# Patient Record
Sex: Female | Born: 1945 | Race: White | Hispanic: No | Marital: Married | Smoking: Never smoker
Health system: Southern US, Community
[De-identification: ages and names within clinical notes are randomized; demographics above are authoritative.]

## PROBLEM LIST (undated history)

## (undated) DIAGNOSIS — I2510 Atherosclerotic heart disease of native coronary artery without angina pectoris: Secondary | ICD-10-CM

## (undated) DIAGNOSIS — R51 Headache: Secondary | ICD-10-CM

## (undated) DIAGNOSIS — M1990 Unspecified osteoarthritis, unspecified site: Secondary | ICD-10-CM

## (undated) DIAGNOSIS — K219 Gastro-esophageal reflux disease without esophagitis: Secondary | ICD-10-CM

## (undated) DIAGNOSIS — Z9889 Other specified postprocedural states: Secondary | ICD-10-CM

## (undated) DIAGNOSIS — G4730 Sleep apnea, unspecified: Secondary | ICD-10-CM

## (undated) DIAGNOSIS — R112 Nausea with vomiting, unspecified: Secondary | ICD-10-CM

## (undated) HISTORY — PX: ESOPHAGOGASTRIC FUNDOPLICATION: SHX405

## (undated) HISTORY — PX: ABDOMINAL HYSTERECTOMY: SHX81

## (undated) HISTORY — DX: Atherosclerotic heart disease of native coronary artery without angina pectoris: I25.10

## (undated) HISTORY — PX: TUBAL LIGATION: SHX77

## (undated) HISTORY — PX: CHOLECYSTECTOMY: SHX55

## (undated) HISTORY — PX: TONSILLECTOMY: SUR1361

## (undated) HISTORY — PX: UPPER GI ENDOSCOPY: SHX6162

## (undated) HISTORY — PX: APPENDECTOMY: SHX54

## (undated) HISTORY — PX: HELLER MYOTOMY: SHX5259

## (undated) HISTORY — PX: DIAGNOSTIC LAPAROSCOPY: SUR761

---

## 1999-01-31 ENCOUNTER — Ambulatory Visit (HOSPITAL_COMMUNITY): Admission: RE | Admit: 1999-01-31 | Discharge: 1999-01-31 | Payer: Self-pay | Admitting: Unknown Physician Specialty

## 1999-07-09 ENCOUNTER — Encounter: Payer: Self-pay | Admitting: Gynecology

## 1999-07-09 ENCOUNTER — Encounter: Admission: RE | Admit: 1999-07-09 | Discharge: 1999-07-09 | Payer: Self-pay | Admitting: Gynecology

## 1999-07-17 ENCOUNTER — Encounter: Admission: RE | Admit: 1999-07-17 | Discharge: 1999-07-17 | Payer: Self-pay | Admitting: Gynecology

## 1999-07-17 ENCOUNTER — Encounter: Payer: Self-pay | Admitting: Gynecology

## 2000-09-30 ENCOUNTER — Encounter: Payer: Self-pay | Admitting: Emergency Medicine

## 2000-09-30 ENCOUNTER — Emergency Department (HOSPITAL_COMMUNITY): Admission: EM | Admit: 2000-09-30 | Discharge: 2000-09-30 | Payer: Self-pay | Admitting: Emergency Medicine

## 2001-02-01 ENCOUNTER — Encounter: Admission: RE | Admit: 2001-02-01 | Discharge: 2001-02-01 | Payer: Self-pay | Admitting: Gynecology

## 2001-02-01 ENCOUNTER — Encounter: Payer: Self-pay | Admitting: Gynecology

## 2002-03-07 ENCOUNTER — Other Ambulatory Visit: Admission: RE | Admit: 2002-03-07 | Discharge: 2002-03-07 | Payer: Self-pay | Admitting: Gynecology

## 2002-03-07 ENCOUNTER — Encounter: Payer: Self-pay | Admitting: Unknown Physician Specialty

## 2002-03-07 ENCOUNTER — Encounter: Admission: RE | Admit: 2002-03-07 | Discharge: 2002-03-07 | Payer: Self-pay | Admitting: Unknown Physician Specialty

## 2002-05-26 HISTORY — PX: BACK SURGERY: SHX140

## 2002-05-26 HISTORY — PX: BREAST EXCISIONAL BIOPSY: SUR124

## 2003-03-30 ENCOUNTER — Encounter: Admission: RE | Admit: 2003-03-30 | Discharge: 2003-03-30 | Payer: Self-pay | Admitting: Gynecology

## 2003-04-12 ENCOUNTER — Encounter (INDEPENDENT_AMBULATORY_CARE_PROVIDER_SITE_OTHER): Payer: Self-pay | Admitting: *Deleted

## 2003-04-12 ENCOUNTER — Ambulatory Visit (HOSPITAL_COMMUNITY): Admission: RE | Admit: 2003-04-12 | Discharge: 2003-04-12 | Payer: Self-pay | Admitting: General Surgery

## 2003-04-12 ENCOUNTER — Ambulatory Visit (HOSPITAL_BASED_OUTPATIENT_CLINIC_OR_DEPARTMENT_OTHER): Admission: RE | Admit: 2003-04-12 | Discharge: 2003-04-12 | Payer: Self-pay | Admitting: General Surgery

## 2003-04-12 HISTORY — PX: BREAST BIOPSY: SHX20

## 2004-04-15 ENCOUNTER — Encounter: Admission: RE | Admit: 2004-04-15 | Discharge: 2004-04-15 | Payer: Self-pay | Admitting: Unknown Physician Specialty

## 2004-08-09 ENCOUNTER — Encounter: Admission: RE | Admit: 2004-08-09 | Discharge: 2004-08-09 | Payer: Self-pay | Admitting: Gynecology

## 2005-03-25 ENCOUNTER — Other Ambulatory Visit: Admission: RE | Admit: 2005-03-25 | Discharge: 2005-03-25 | Payer: Self-pay | Admitting: Gynecology

## 2005-07-04 ENCOUNTER — Encounter: Admission: RE | Admit: 2005-07-04 | Discharge: 2005-07-04 | Payer: Self-pay | Admitting: Unknown Physician Specialty

## 2005-12-12 ENCOUNTER — Encounter: Admission: RE | Admit: 2005-12-12 | Discharge: 2005-12-12 | Payer: Self-pay | Admitting: Specialist

## 2006-03-05 ENCOUNTER — Inpatient Hospital Stay (HOSPITAL_COMMUNITY): Admission: RE | Admit: 2006-03-05 | Discharge: 2006-03-16 | Payer: Self-pay | Admitting: Specialist

## 2006-03-08 ENCOUNTER — Encounter: Payer: Self-pay | Admitting: Vascular Surgery

## 2006-03-16 ENCOUNTER — Ambulatory Visit: Payer: Self-pay | Admitting: Internal Medicine

## 2006-05-27 ENCOUNTER — Encounter: Admission: RE | Admit: 2006-05-27 | Discharge: 2006-05-27 | Payer: Self-pay | Admitting: Unknown Physician Specialty

## 2006-06-02 ENCOUNTER — Encounter: Admission: RE | Admit: 2006-06-02 | Discharge: 2006-06-02 | Payer: Self-pay | Admitting: Unknown Physician Specialty

## 2006-06-17 ENCOUNTER — Encounter: Admission: RE | Admit: 2006-06-17 | Discharge: 2006-06-17 | Payer: Self-pay | Admitting: Unknown Physician Specialty

## 2006-06-23 ENCOUNTER — Encounter (INDEPENDENT_AMBULATORY_CARE_PROVIDER_SITE_OTHER): Payer: Self-pay | Admitting: Specialist

## 2006-06-23 ENCOUNTER — Encounter: Admission: RE | Admit: 2006-06-23 | Discharge: 2006-06-23 | Payer: Self-pay | Admitting: Unknown Physician Specialty

## 2006-06-23 HISTORY — PX: BREAST BIOPSY: SHX20

## 2006-07-13 ENCOUNTER — Ambulatory Visit (HOSPITAL_BASED_OUTPATIENT_CLINIC_OR_DEPARTMENT_OTHER): Admission: RE | Admit: 2006-07-13 | Discharge: 2006-07-14 | Payer: Self-pay | Admitting: General Surgery

## 2006-07-13 ENCOUNTER — Encounter: Admission: RE | Admit: 2006-07-13 | Discharge: 2006-07-13 | Payer: Self-pay | Admitting: General Surgery

## 2006-07-13 ENCOUNTER — Encounter (INDEPENDENT_AMBULATORY_CARE_PROVIDER_SITE_OTHER): Payer: Self-pay | Admitting: *Deleted

## 2006-07-13 HISTORY — PX: BREAST EXCISIONAL BIOPSY: SUR124

## 2007-07-14 ENCOUNTER — Encounter: Admission: RE | Admit: 2007-07-14 | Discharge: 2007-07-14 | Payer: Self-pay | Admitting: Unknown Physician Specialty

## 2008-03-15 ENCOUNTER — Encounter: Admission: RE | Admit: 2008-03-15 | Discharge: 2008-03-15 | Payer: Self-pay | Admitting: Family Medicine

## 2008-07-15 ENCOUNTER — Encounter: Admission: RE | Admit: 2008-07-15 | Discharge: 2008-07-15 | Payer: Self-pay | Admitting: Specialist

## 2008-12-02 ENCOUNTER — Encounter: Payer: Self-pay | Admitting: Cardiovascular Disease

## 2008-12-07 ENCOUNTER — Ambulatory Visit: Payer: Self-pay

## 2008-12-07 ENCOUNTER — Encounter: Payer: Self-pay | Admitting: Cardiovascular Disease

## 2009-01-05 DIAGNOSIS — G43909 Migraine, unspecified, not intractable, without status migrainosus: Secondary | ICD-10-CM | POA: Insufficient documentation

## 2009-01-05 DIAGNOSIS — H9319 Tinnitus, unspecified ear: Secondary | ICD-10-CM | POA: Insufficient documentation

## 2009-01-05 DIAGNOSIS — Z860100 Personal history of colon polyps, unspecified: Secondary | ICD-10-CM | POA: Insufficient documentation

## 2009-01-05 DIAGNOSIS — F329 Major depressive disorder, single episode, unspecified: Secondary | ICD-10-CM

## 2009-01-05 DIAGNOSIS — M48061 Spinal stenosis, lumbar region without neurogenic claudication: Secondary | ICD-10-CM | POA: Insufficient documentation

## 2009-01-05 DIAGNOSIS — F411 Generalized anxiety disorder: Secondary | ICD-10-CM | POA: Insufficient documentation

## 2009-01-05 DIAGNOSIS — Z8719 Personal history of other diseases of the digestive system: Secondary | ICD-10-CM

## 2009-01-05 DIAGNOSIS — E785 Hyperlipidemia, unspecified: Secondary | ICD-10-CM

## 2009-01-09 ENCOUNTER — Ambulatory Visit: Payer: Self-pay | Admitting: Cardiovascular Disease

## 2009-01-09 DIAGNOSIS — R42 Dizziness and giddiness: Secondary | ICD-10-CM

## 2009-01-09 DIAGNOSIS — I10 Essential (primary) hypertension: Secondary | ICD-10-CM | POA: Insufficient documentation

## 2009-01-15 ENCOUNTER — Encounter (INDEPENDENT_AMBULATORY_CARE_PROVIDER_SITE_OTHER): Payer: Self-pay

## 2009-01-15 ENCOUNTER — Ambulatory Visit: Payer: Self-pay

## 2009-01-15 ENCOUNTER — Encounter: Payer: Self-pay | Admitting: Cardiology

## 2009-01-19 ENCOUNTER — Telehealth: Payer: Self-pay | Admitting: Cardiovascular Disease

## 2009-01-22 ENCOUNTER — Encounter: Payer: Self-pay | Admitting: Cardiovascular Disease

## 2009-05-03 ENCOUNTER — Ambulatory Visit: Payer: Self-pay | Admitting: Cardiovascular Disease

## 2009-10-29 ENCOUNTER — Encounter: Admission: RE | Admit: 2009-10-29 | Discharge: 2009-10-29 | Payer: Self-pay | Admitting: Family Medicine

## 2009-10-31 ENCOUNTER — Encounter: Admission: RE | Admit: 2009-10-31 | Discharge: 2009-10-31 | Payer: Self-pay | Admitting: Family Medicine

## 2009-11-26 ENCOUNTER — Ambulatory Visit (HOSPITAL_BASED_OUTPATIENT_CLINIC_OR_DEPARTMENT_OTHER): Admission: RE | Admit: 2009-11-26 | Discharge: 2009-11-26 | Payer: Self-pay | Admitting: Otolaryngology

## 2009-12-01 ENCOUNTER — Ambulatory Visit: Payer: Self-pay | Admitting: Internal Medicine

## 2010-05-23 ENCOUNTER — Encounter
Admission: RE | Admit: 2010-05-23 | Discharge: 2010-05-23 | Payer: Self-pay | Source: Home / Self Care | Attending: Family Medicine | Admitting: Family Medicine

## 2010-06-11 ENCOUNTER — Telehealth (INDEPENDENT_AMBULATORY_CARE_PROVIDER_SITE_OTHER): Payer: Self-pay | Admitting: *Deleted

## 2010-06-12 ENCOUNTER — Encounter (HOSPITAL_COMMUNITY)
Admission: RE | Admit: 2010-06-12 | Discharge: 2010-06-25 | Payer: Self-pay | Source: Home / Self Care | Attending: Family Medicine | Admitting: Family Medicine

## 2010-06-12 ENCOUNTER — Encounter: Payer: Self-pay | Admitting: Internal Medicine

## 2010-06-12 ENCOUNTER — Ambulatory Visit: Admission: RE | Admit: 2010-06-12 | Discharge: 2010-06-12 | Payer: Self-pay | Source: Home / Self Care

## 2010-06-12 ENCOUNTER — Encounter: Payer: Self-pay | Admitting: Cardiology

## 2010-06-16 ENCOUNTER — Encounter: Payer: Self-pay | Admitting: Specialist

## 2010-06-27 NOTE — Progress Notes (Signed)
Summary: nuc pre procedure  Phone Note Outgoing Call Call back at Home Phone [phone]   Call placed by: Cathlyn Parsons RN,  June 11, 2010 2:34 PM Call placed to: Patient Summary of Call: Reviewed information on Myoview Information Sheet (see scanned document for further details).  Spoke with patient.      Nuclear Med Background Indications for Stress Test: Evaluation for Ischemia   History: Echo  History Comments: 10 Echo with EF=nl  Symptoms: Chest Pain    Nuclear Pre-Procedure Cardiac Risk Factors: CVA, Family History - CAD, Hypertension, Lipids, TIA Height (in): 61

## 2010-06-27 NOTE — Assessment & Plan Note (Addendum)
Summary: Cardiology Nuclear Testing  Nuclear Med Background Indications for Stress Test: Evaluation for Ischemia   History: Echo, Myocardial Perfusion Study  History Comments: '93 JXB:JYNWGN; '10 Echo:EF=65%  Symptoms: Chest Pressure, DOE, Light-Headedness, Palpitations  Symptoms Comments: Last episode of CP:2-3 months ago.   Nuclear Pre-Procedure Cardiac Risk Factors: CVA, Family History - CAD, Hypertension, Lipids, Obesity, TIA Caffeine/Decaff Intake: None NPO After: 9:00 PM Lungs: Clear IV 0.9% NS with Angio Cath: 22g     IV Site: R Hand IV Started by: Stanton Kidney, EMT-P Chest Size (in) 38     Cup Size B     Height (in): 61 Weight (lb): 181 BMI: 34.32  Nuclear Med Study 1 or 2 day study:  1 day     Stress Test Type:  Stress Reading MD:  Marca Ancona, MD     Referring MD:  Blane Ohara, MD Resting Radionuclide:  Technetium 50m Tetrofosmin     Resting Radionuclide Dose:  11 mCi  Stress Radionuclide:  Technetium 75m Tetrofosmin     Stress Radionuclide Dose:  33 mCi   Stress Protocol Exercise Time (min):  5:00 min     Max HR:  155 bpm     Predicted Max HR:  154 bpm  Max Systolic BP: 217 mm Hg     Percent Max HR:  100.65 %     METS: 7.0 Rate Pressure Product:  56213    Stress Test Technologist:  Rea College, CMA-N     Nuclear Technologist:  Doyne Keel, CNMT  Rest Procedure  Myocardial perfusion imaging was performed at rest 45 minutes following the intravenous administration of Technetium 10m Tetrofosmin.  Stress Procedure  The patient exercised for five minutes on the Bruce protocol.  The patient stopped due to fatigue and denied any chest pain.  There were no diagnostic ST-T wave changes, only occasional PAC's.  She did have a hypertensive response immediately post exercise, 217/109.  Technetium 64m Tetrofosmin was injected at peak exercise and myocardial perfusion imaging was performed after a brief delay.  QPS Raw Data Images:  Normal; no motion artifact; normal  heart/lung ratio. Stress Images:  Normal homogeneous uptake in all areas of the myocardium. Rest Images:  Normal homogeneous uptake in all areas of the myocardium. Subtraction (SDS):  Normal Transient Ischemic Dilatation:  .99  (Normal <1.22)  Lung/Heart Ratio:  .33  (Normal <0.45)  Quantitative Gated Spect Images QGS EDV:  58 ml QGS ESV:  21 ml QGS EF:  65 %  Findings Normal nuclear study      Overall Impression  Exercise Capacity: Fair exercise capacity. BP Response: Hypertensive blood pressure response. Clinical Symptoms: There is dyspnea. ECG Impression: No significant ST segment change suggestive of ischemia. Overall Impression: Normal stress nuclear study.  Appended Document: Cardiology Nuclear Testing copy faxed to Dr. Sedalia Muta

## 2010-07-15 HISTORY — PX: ESOPHAGOGASTRODUODENOSCOPY: SHX1529

## 2010-07-15 HISTORY — PX: COLONOSCOPY: SHX174

## 2010-10-11 NOTE — Consult Note (Signed)
NAME:  Jessica Gould, Jessica Gould NO.:  [id number]   MEDICAL RECORD NO.:  [id number]          PATIENT TYPE:  INP   LOCATION:  5015                         FACILITY:  [facility]   PHYSICIAN:  Iva Boop, MD,FACGDATE OF BIRTH:  18-Nov-1945   DATE OF CONSULTATION:  DATE OF DISCHARGE:                                   CONSULTATION   REASON FOR CONSULTATION:  Chest pain and dysphagia.   ASSESSMENT:  A 65 year old white woman with a long history of recurrent  dysphagia over the years.  She had been cared for by Dr. Laurell Roof  in [place] and has had a dilation sometime in the past several months it  seems.  She had a worsening dysphagia here after being admitted subsequent  to a lumbar disk surgery on March 05, 2006.  A CT scan has shown  thickening of the distal esophagus (CT ordered to rule out pulmonary embolus  in the because of chest pain).  We think the chest pain is probably related  to her dysphagia at this point.   PLAN:  Upper GI endoscopy with possible esophageal dilation.  This is  explained to the patient.  She indicates that she would like to follow up  with Dr. Jennye Boroughs once she leaves the hospital.   HISTORY:  As above.  She has had worsening progressive dysphagia over the  past 2 to 3 months.  She has had 2 or 3 dilations over the past years.  She  says that it runs in her family.  She does not have much in the way of  heartburn, though she takes AcipHex or acid depression at home.  As far as  other GI history is concerned, she has had colonoscopies and a polypectomy  in the past.   CURRENT MEDICATIONS:  Dulcolax, Premarin, Colace, OxyContin, MiraLax,  Prozac, Robaxin, Medrol Dosepak, Protonix 40 mg daily, potassium chloride,  and Lyrica.   ALLERGIES:  CATGUT SUTURE, PENICILLIN, SULFA, NSAIDs HAVE CAUSED TINNITUS.   PAST MEDICAL HISTORY:  1. Migraine headaches.  2. Depression.  3. Anxiety.  4. Tinnitus.  5. Dyslipidemia.  6. Colon  polyps.  7. GERD.  8. Esophageal strictures.  9. Spinal stenosis.   PAST SURGICAL HISTORY:  1. Total abdominal hysterectomy and bilateral salpingo-oophorectomy.  2. Cesarean sections.  3. Benign left breast biopsy.  4. Right shoulder surgery.  5. Cholecystectomy.  6. Appendectomy.  7. L-spine decompression.   FAMILY HISTORY:  Renal cell carcinoma.  Coronary artery disease in her  father.  Her mother had stomach cancer and an abdominal aneurysm.   SOCIAL HISTORY:  She is married and lives in [place].  She is an  Production designer, theatre/television/film at a [employer].  No alcohol or tobacco.   REVIEW OF SYSTEMS:  She has had some right lower extremity numbness since  her surgery.  She has had difficulty getting an IV.  She has had a PICC line  placed and some mild dyspnea.  She has chronic recurrent migraine and stress  headaches.  She has low back pain.  She has a permanent front bridge, as far  as  dentures.  All other systems appear negative at this time.   PHYSICAL EXAMINATION:  GENERAL:  Physical exam reveals an obese, middle-aged  white woman.  VITAL SIGNS:  Temperature 98.3, pulse 80, blood pressure 183/85.  HEENT:  Eyes nonicteric.  ENT normal mouth and posterior pharynx.  NECK:  Supple with no thyromegaly or mass.  CHEST:  Clear anteriorly.  HEART:  S1, S2.  No rubs or gallops.  ABDOMEN:  Abdomen is soft, nontender, without organomegaly or mass.  Bowel  sounds are present.  EXTREMITIES:  Extremities show no lower extremity edema.  The skin is warm  and dry without acute rash.  LYMPH NODES:  No neck or supraclavicular nodes detected.  PSYCH:  She is alert and oriented x3.   LABORATORY DATA:  Hemoglobin is 9.6, white count 4.2, hematocrit 28, BMET is  normal, sodium 137, potassium 3.3 (this is slightly low), chloride 104, CO2  28, BUN 4, creatinine 0.6, glucose 118.   NOTE:  I have reviewed the CT report and the other labs in the hospital  chart.   Her hemoglobin was normal upon  admission, so I am assuming this is a  postoperative anemia sort of problem.  It will need follow up at discharge.   I appreciate the opportunity to care for this patient.      Iva Boop, MD,FACG  Electronically Signed     CEG/MEDQ  D:  03/11/2006  T:  03/12/2006  Job:  [id number]   cc:   Kerrin Champagne, M.D.  Lovette Cliche Misenheimer

## 2010-10-11 NOTE — Discharge Summary (Signed)
NAME:  Jessica Gould, Jessica Gould NO.:  [id number]   MEDICAL RECORD NO.:  [id number]          PATIENT TYPE:  INP   LOCATION:  5015                         FACILITY:  [facility]   PHYSICIAN:  Kerrin Champagne, M.D.   DATE OF BIRTH:  10-May-1946   DATE OF ADMISSION:  03/05/2006  DATE OF DISCHARGE:  03/16/2006                               DISCHARGE SUMMARY   ADMISSION DIAGNOSES:  1. 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  2. Migraine headaches.  3. Depression.  4. Anxiety.  5. Tinnitus.  6. Dyslipidemia.  7. History of colon polyps.  8. History of gastroesophageal reflux disease.  9. History of esophageal strictures requiring dilatation.   DISCHARGE DIAGNOSES:  1. Degenerative spondylolisthesis L4-5 with bilateral L5 nerve root      entrapment secondary to lateral recess stenosis and degenerative      spondylolisthesis L4 on L5.  2. Moderate central canal stenosis.  3. Migraine headaches.  4. Depression.  5. Anxiety.  6. Tinnitus.  7. Dyslipidemia.  8. History of colon polyps.  9. History of gastroesophageal reflux disease.  10.History of esophageal strictures requiring dilatation.  11.Complications of hardware lumbar spine right L4 nerve impingement      with right L4 pedicle screw.  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  13.Recurrent dysphasia requiring esophageal dilatation during the      hospital stay.  14.Hypokalemia resolved at discharge.  15.Posthemorrhagic anemia.  16.Mild hyperglycemia.   PROCEDURES:  On admission March 05, 2006 the patient underwent:  1. Central decompressive laminectomy L4-5 with bilateral L4 and      bilateral L4 nerve root decompression.  2. Left L4-5 transforaminal lumbar interbody fusion with DePuy Leopard      cage and local bone graft.  3. Posterolateral fusion L4-L5 with  combination of local bone graft      and Symphony allograft bone graft.  4. Single level fixed internal fixation using pedicle screws and rods.  5. Performed on March 11, 2006.  The patient underwent an endoscopy      and esophageal dilatation by Dr. Leone Payor.  6. On March 16, 2006, the patient underwent right hip intra-      articular injection by the radiology department.  7. On March 14, 2006 the patient underwent removal of right L4-5      pedicle screw and rod an exploration of right L4 nerve root by Dr.      Otelia Sergeant under general anesthesia.   TESTS PERFORMED DURING THE HOSPITAL STAY:  Included a Doppler study of  the lower extremities which ruled out DVT done on March 08, 2006.  Myelogram and post myelogram CT scan performed on March 09, 2006 and  spiral CT of the chest on March 11, 2006 with no evidence of pulmonary  emboli and PICC line placement on March 10, 2006.   HISTORY:  The patient is a 65 year old female with history of chronic  low back pain with radiation into her  buttocks and thighs.  She has had  worsening of her discomfort to the point of having difficulty with  prolonged standing and ambulation.  She requires narcotic medications  which do not even relieve her discomfort.  Facette injections initially  were of benefit in diminishing her discomfort but she has had  recurrence.  Studies including myelogram and post myelogram CT  demonstrates severe arthrosis changes at the L4-5 level with grade 1  spondylolisthesis, bilateral lateral recess stenosis, moderate central  canal stenosis at this level.  Flexion and extension radiographs did  show motion at the L4-5 spondylolisthesis worsening over a few  millimeters with motion.  It was felt that she would require surgical  intervention and was admitted for the procedure as stated above.   BRIEF HOSPITAL COURSE:  The patient tolerated the procedure under  general anesthesia without complications.   Postoperatively, the patient  was treated for early ileus with Reglan IV.  She was kept n.p.o. until  she had bowel sounds.  Potassium was added to her IV fluids and she also  received oral supplementation.  Eventually she was having flatus with  normal bowel sounds and her diet was progressed.  She was also given  Magic Mouth Wash for her sore throat.  The patient developed significant  right leg pain postoperatively.  She required abundant narcotic  medications IV and p.o. for the first several days postoperatively to  get relief of her symptoms.  Doppler study of the right lower extremity  was performed and no DVT was noted.  She was started on a Medrol Dosepak  as well as Lyrica, both of which gave her minimal relief of her  symptoms.  Eventually a myelogram and post myelogram CT were performed.  There was notable impingement at the right L4 level.  The patient was  taken back to surgery for removal of the pedicle screw as stated above.   The patient developed chest pain during the hospital stay which she felt  was related to gastroesophageal reflux disease and esophageal  strictures.  A GI consult was obtained and Dr. Leone Payor to the patient to  surgery and underwent endoscopy and esophageal dilatation.  Prior to  this, she had received a spiral CT to rule out pulmonary emboli which  was negative for pulmonary emboli but did show distal esophageal  thickening.  Cardiac enzymes were obtained which were also negative.  The patient did have relief of her symptoms after the esophageal  dilatation.  She was on Protonix as well.  A PICC line was placed at  during the hospital stay as she continued to need IV fluids.   The patient had complaints of inguinal pain on the right towards the  latter part of her hospital stay.  Plain radiographs demonstrated only  arthrosis of the right hip joint with superolateral hip joint narrowing. She underwent an interarticular injection to the right hip  which gave  her relief of the right inguinal discomfort.  The patient was eventually  weaned from her IV analgesics to oral analgesics and Toradol was given  during the hospital stay following the second spinal surgery.  The  patient was not allowed oral anti-inflammatories secondary to her  gastroesophageal reflux disease.  Physical therapy assisted the patient  throughout the hospital stay with ambulation and gait training.  She did  have an Aspen LSO which was donned and doffed at bedside.  Prior to  discharge the patient was stable with this activity as well as  ambulation.  Occupational therapy assisted her with ADLs and she was  independent at discharge from their standpoint as well.  The patient  stabilized with her discomfort following the second spinal procedure.  She was afebrile with vital signs stable.  The patient did have  assistance at home and therefore was discharged to her home for  continuation of her care there.   PERTINENT LABORATORY VALUES:  Admission CBC was within normal limits.  White blood cell count did elevate to 20.5 but normalized back to 8.6  prior to discharge.  Hemoglobin was lowest at 9.2 at the day of  discharge.  The patient was treated with iron supplementation.  Hematocrit was stable at discharge of 27 which was lowest value during  the hospital stay as well.  The patient had episodic old hyponatremia  and hypokalemia which resolved prior to discharge.  She also had  hypocalcemia, the lowest value being 7.5, however normal at discharge.  She had several elevated values of glucose in her routine chemistry  studies.  Discharge glucose was stable at 104.  Cardiac enzymes were  negative when checked on October 16.  BNP was within normal limits on  October 18 and October 19.  EKG on October 16 showed sinus rhythm.  When  compared to previous EKG, no significant changes noted.   PLAN:  The patient was discharged to home.  She was advised to follow in   the low carbohydrate diet.  She will advance the diet slowly from clear  to soft and then to regular diet.  The patient will continue changing  her dressing daily at home and will be allowed to shower.  She is to  avoid lifting over 10 pounds.  She will avoid bending, stooping or  twisting.  She should walk as tolerated at home.   MEDICATIONS AT DISCHARGE:  1. Include OxyContin 40 mg q.12 h.  2. OxyIR 5 mg one every 4-6 hours as needed for pain.  3. Skelaxin 800 mg one every 8 hours and needed for spasm.  4. Valium 5 mg one every 8 hours as needed for anxiety.  5. She will resume all her home medications.  6. She will also use Tylenol Arthritis as needed.   She will return to see Dr. Otelia Sergeant 2 weeks from the date of her surgery.  She will follow up with her gastroenterologist in [place] in regards to  her esophageal strictures.  The patient will call the office if she has  questions prior to her return office visit.  CONDITION ON DISCHARGE:  Stable.      Wende Neighbors, P.A.      Kerrin Champagne, M.D.  Electronically Signed    SMV/MEDQ  D:  04/14/2006  T:  04/14/2006  Job:  [id number]

## 2010-10-11 NOTE — Op Note (Signed)
NAME:  ZEOLA, BRYS NO.:  [id number]   MEDICAL RECORD NO.:  [id number]          PATIENT TYPE:  AMB   LOCATION:  DSC                          FACILITY:  [facility]   PHYSICIAN:  Angelia Mould. Derrell Lolling, M.D.DATE OF BIRTH:  [birth year]   DATE OF PROCEDURE:  07/13/2006  DATE OF DISCHARGE:                               OPERATIVE REPORT   PREOPERATIVE DIAGNOSES:  1. Recurrent papilloma, intraductal, subareolar  2. Papilloma, deep left breast, medial.   POSTOPERATIVE DIAGNOSES:  1. Recurrent papilloma, intraductal, subareolar  2. Papilloma, deep left breast, medial.   OPERATION PERFORMED:  1. Excision left breast mass with needle localization and specimen      mammogram.  2. Excision subareolar intraductal papilloma.   SURGEON:  Angelia Mould. Derrell Lolling, M.D.   OPERATIVE INDICATIONS:  This is a 65 year old white female who underwent  excision of a intraductal papilloma on the left breast 2004.  She healed  from that surgery and her nipple drainage resolved.  That was a benign  papilloma.  For the past 6 months she has a clear drainage from the left  nipple.  There is no palpable mass.  She has had mammograms, ultrasound,  MRI, and ductogram.  There is an intraductal mass in the 9 o'clock  position of the subareolar area and there is a second mass more  posteriorly at the 9 o'clock position, about 6 cm deep and medial to the  nipple.  A more peripherally located mass underwent MRI-guided biopsy  and showed a benign papilloma with a few calcifications but no atypia.  Thus, she has two probable papillomas, one intraductal subareolar at the  9 o'clock position and a second more medial and posterior at the 9  o'clock position.  She had been evaluated as an outpatient.  I advised  her to have both of these excised.  She is aware that this may cause  some deformity and flattening of the breast.  She is comfortable with  that.  She is operated upon electively.   OPERATIVE TECHNIQUE:   The patient underwent needle localization of the  deeper papilloma in the medial posterior left breast by Dr. Adriana Reams.  This needle localization was satisfactory.  She is brought to [facility]  surgery to the operating room.  General LMA anesthetic was induced.  Intravenous antibiotics were given.  The patient was identified as to  correct procedure and correct patient and correct site.  The left breast  was prepped and draped in a sterile fashion.  0.5% Marcaine with  epinephrine was used as local infiltration anesthetic.  I made a curved  circumareolar incision in the medial left breast.  This was about 1.5 cm  outside the true areolar margin where she had a previous scar.  Dissection was carried down through the skin and subcutaneous tissue and  then I dissected all the subareolar tissue and identified what I thought  was probably a duct with a papilloma in it and marked this with a silk  suture.  I then took the dissection laterally and intersected the wire  in the breast tissue medially.  With with further medial dissection, I  could mobilize the wire out of the skin and into the wound.  I then  dissected completely around the wire, taking all of the wire and its tip  with a specimen.  I brought the dissection back toward the mid pole of  the breast and connected all of the breast tissue and sent one piece of  breast tissue with presumably both papillomas in it.  The subareolar  papilloma was marked with a silk suture and the deeper, more  peripherally located papilloma was marked with the wire.  Dr. Adriana Reams performed specimen anagrams, said that I had the marker clip and  the mass removed.  He said the mass was somewhat close to the edge, but  it was completely removed.  Felt this was satisfactory.  Hemostasis was  excellent and achieved electrocautery.  The wound was irrigated with  saline.  Deeper breast tissue was closed with interrupted sutures of 3-0  Vicryl.  The skin  was closed with a running subcuticular suture of 4-0  Monocryl and a few interrupted 4-0 nylon sutures and Steri-Strips.  Kling bandages were placed and the patient taken recovery room in stable  condition.  Estimated blood loss was about 20 mL.  Complications none.  The sponge, needle and instrument counts were correct.      Angelia Mould. Derrell Lolling, M.D.  Electronically Signed     HMI/MEDQ  D:  07/13/2006  T:  07/14/2006  Job:  [id number]   cc:   Norva Pavlov, M.D.  Wyonia Hough, M.D.

## 2010-12-26 ENCOUNTER — Ambulatory Visit (HOSPITAL_BASED_OUTPATIENT_CLINIC_OR_DEPARTMENT_OTHER): Payer: Medicare Other | Attending: Family Medicine

## 2010-12-26 DIAGNOSIS — I491 Atrial premature depolarization: Secondary | ICD-10-CM | POA: Insufficient documentation

## 2010-12-26 DIAGNOSIS — Z79899 Other long term (current) drug therapy: Secondary | ICD-10-CM | POA: Insufficient documentation

## 2010-12-26 DIAGNOSIS — G4733 Obstructive sleep apnea (adult) (pediatric): Secondary | ICD-10-CM | POA: Insufficient documentation

## 2010-12-28 DIAGNOSIS — Z79899 Other long term (current) drug therapy: Secondary | ICD-10-CM

## 2010-12-28 DIAGNOSIS — I491 Atrial premature depolarization: Secondary | ICD-10-CM

## 2010-12-28 DIAGNOSIS — G4733 Obstructive sleep apnea (adult) (pediatric): Secondary | ICD-10-CM

## 2010-12-28 NOTE — Procedures (Signed)
NAME:  Jessica Gould, Jessica Gould NO.:  [id number]  MEDICAL RECORD NO.:  [id number]          PATIENT TYPE:  OUT  LOCATION:  SLEEP CENTER                 FACILITY:  [facility]  PHYSICIAN:  Clinton D. Maple Hudson, MD, FCCP, FACPDATE OF BIRTH:  04/27/1946  DATE OF STUDY:  12/26/2010                           NOCTURNAL POLYSOMNOGRAM  REFERRING PHYSICIAN:  KIRSTEN COX  REFERRING PHYSICIAN:  Mickey Farber, MD.  INDICATION FOR STUDY:  Hypersomnia with sleep apnea.  EPWORTH SLEEPINESS SCORE:  16/24.  BMI 35.2.  Weight 180 pounds.  Height 60 inches.  Neck 14 inches.  HOME MEDICATIONS:  Charted and reviewed.  SLEEP ARCHITECTURE:  Split-study protocol.  During the diagnostic phase total sleep time 140.5 minutes with sleep efficiency 73.9%.  Stage I was 23.1%, stage II 75.8%, stage III 1.1%.  REM absent.  Sleep latency 12 minutes.  Awake after sleep onset 35.5 minutes.  Arousal index 44.  BEDTIME MEDICATION:  Cyclobenzaprine hydrochloride for back pain.  RESPIRATORY DATA:  Split-study protocol.  Apnea-hypopnea index (AHI) 28.5 per hour.  A total of 48 events was scored including 8 obstructive apneas and 40 hypopneas.  Events were seen in all sleep positions, especially while supine.  CPAP was then titrated to 14 CWP, AHI 0.7 per hour.  She wore a small ResMed Mirage Quattro full-face mask with heated humidifier and C-Flex of 3.  OXYGEN DATA:  Very loud snoring before CPAP with oxygen desaturation to a nadir of 84% on room air.  With CPAP titration, mean oxygen saturation held 94.2% on room air, and snoring was prevented.  CARDIAC DATA:  Sinus rhythm with occasional PAC.  MOVEMENT-PARASOMNIA:  No significant movement disturbance.  Bathroom x1.  IMPRESSIONS-RECOMMENDATIONS: 1. Moderate obstructive sleep apnea-hypopnea syndrome, AHI 28.5 per     hour with loud snoring.  Events in all sleep positions and oxygen     desaturation to a nadir of 84% on room air. 2. Successful CPAP titration to  14 CWP, AHI 0.7 per hour.  She wore a     small ResMed Mirage Quattro full-face mask with heated humidifier     and C-Flex of 3.  Snoring was prevented and oxygenation normalized.     Clinton D. Maple Hudson, MD, [facility], FACP Diplomate, Biomedical engineer of Sleep Medicine Electronically Signed    CDY/MEDQ  D:  12/28/2010 11:45:51  T:  12/28/2010 15:24:09  Job:  [id number]

## 2011-02-12 ENCOUNTER — Other Ambulatory Visit: Payer: Self-pay | Admitting: Specialist

## 2011-02-12 DIAGNOSIS — M545 Low back pain: Secondary | ICD-10-CM

## 2011-02-13 ENCOUNTER — Ambulatory Visit
Admission: RE | Admit: 2011-02-13 | Discharge: 2011-02-13 | Disposition: A | Discharge status: Home / Self Care | Payer: Medicare Other | Source: Ambulatory Visit | Attending: Specialist | Admitting: Specialist

## 2011-02-13 DIAGNOSIS — M545 Low back pain: Secondary | ICD-10-CM

## 2011-03-03 ENCOUNTER — Other Ambulatory Visit: Payer: Self-pay | Admitting: Family Medicine

## 2011-03-03 DIAGNOSIS — N6320 Unspecified lump in the left breast, unspecified quadrant: Secondary | ICD-10-CM

## 2011-03-11 ENCOUNTER — Ambulatory Visit
Admission: RE | Admit: 2011-03-11 | Discharge: 2011-03-11 | Disposition: A | Discharge status: Home / Self Care | Payer: Medicare Other | Source: Ambulatory Visit | Attending: Family Medicine | Admitting: Family Medicine

## 2011-03-11 ENCOUNTER — Other Ambulatory Visit: Payer: Self-pay | Admitting: Family Medicine

## 2011-03-11 DIAGNOSIS — N6320 Unspecified lump in the left breast, unspecified quadrant: Secondary | ICD-10-CM

## 2011-03-24 ENCOUNTER — Other Ambulatory Visit: Payer: Self-pay | Admitting: Family Medicine

## 2011-03-24 DIAGNOSIS — R51 Headache: Secondary | ICD-10-CM

## 2011-03-24 DIAGNOSIS — I10 Essential (primary) hypertension: Secondary | ICD-10-CM

## 2011-03-25 ENCOUNTER — Other Ambulatory Visit: Payer: Medicare Other

## 2011-04-02 ENCOUNTER — Ambulatory Visit
Admission: RE | Admit: 2011-04-02 | Discharge: 2011-04-02 | Disposition: A | Discharge status: Home / Self Care | Payer: Medicare Other | Source: Ambulatory Visit | Attending: Family Medicine | Admitting: Family Medicine

## 2011-04-02 DIAGNOSIS — R51 Headache: Secondary | ICD-10-CM

## 2011-04-02 DIAGNOSIS — I10 Essential (primary) hypertension: Secondary | ICD-10-CM

## 2011-10-03 DIAGNOSIS — Z0000 Encounter for general adult medical examination without abnormal findings: Secondary | ICD-10-CM | POA: Diagnosis not present

## 2011-10-03 DIAGNOSIS — I10 Essential (primary) hypertension: Secondary | ICD-10-CM | POA: Diagnosis not present

## 2011-10-03 DIAGNOSIS — E038 Other specified hypothyroidism: Secondary | ICD-10-CM | POA: Diagnosis not present

## 2011-10-03 DIAGNOSIS — E559 Vitamin D deficiency, unspecified: Secondary | ICD-10-CM | POA: Diagnosis not present

## 2011-10-03 DIAGNOSIS — E782 Mixed hyperlipidemia: Secondary | ICD-10-CM | POA: Diagnosis not present

## 2011-10-06 DIAGNOSIS — H9319 Tinnitus, unspecified ear: Secondary | ICD-10-CM | POA: Diagnosis not present

## 2011-10-06 DIAGNOSIS — H903 Sensorineural hearing loss, bilateral: Secondary | ICD-10-CM | POA: Diagnosis not present

## 2011-10-06 DIAGNOSIS — H93239 Hyperacusis, unspecified ear: Secondary | ICD-10-CM | POA: Diagnosis not present

## 2011-10-15 DIAGNOSIS — R51 Headache: Secondary | ICD-10-CM | POA: Diagnosis not present

## 2011-10-15 DIAGNOSIS — R1319 Other dysphagia: Secondary | ICD-10-CM | POA: Diagnosis not present

## 2011-10-15 DIAGNOSIS — E782 Mixed hyperlipidemia: Secondary | ICD-10-CM | POA: Diagnosis not present

## 2011-10-15 DIAGNOSIS — H539 Unspecified visual disturbance: Secondary | ICD-10-CM | POA: Diagnosis not present

## 2011-10-15 DIAGNOSIS — Z8673 Personal history of transient ischemic attack (TIA), and cerebral infarction without residual deficits: Secondary | ICD-10-CM | POA: Diagnosis not present

## 2011-10-15 DIAGNOSIS — Z0000 Encounter for general adult medical examination without abnormal findings: Secondary | ICD-10-CM | POA: Diagnosis not present

## 2011-10-15 DIAGNOSIS — M159 Polyosteoarthritis, unspecified: Secondary | ICD-10-CM | POA: Diagnosis not present

## 2011-10-15 DIAGNOSIS — Z1211 Encounter for screening for malignant neoplasm of colon: Secondary | ICD-10-CM | POA: Diagnosis not present

## 2011-10-20 DIAGNOSIS — G459 Transient cerebral ischemic attack, unspecified: Secondary | ICD-10-CM | POA: Diagnosis not present

## 2011-10-20 DIAGNOSIS — I6529 Occlusion and stenosis of unspecified carotid artery: Secondary | ICD-10-CM | POA: Diagnosis not present

## 2011-10-20 DIAGNOSIS — H539 Unspecified visual disturbance: Secondary | ICD-10-CM | POA: Diagnosis not present

## 2011-10-20 DIAGNOSIS — I079 Rheumatic tricuspid valve disease, unspecified: Secondary | ICD-10-CM | POA: Diagnosis not present

## 2011-10-20 DIAGNOSIS — K222 Esophageal obstruction: Secondary | ICD-10-CM | POA: Diagnosis not present

## 2011-10-20 DIAGNOSIS — R51 Headache: Secondary | ICD-10-CM | POA: Diagnosis not present

## 2011-10-20 DIAGNOSIS — K449 Diaphragmatic hernia without obstruction or gangrene: Secondary | ICD-10-CM | POA: Diagnosis not present

## 2011-10-20 DIAGNOSIS — R609 Edema, unspecified: Secondary | ICD-10-CM | POA: Diagnosis not present

## 2011-10-20 DIAGNOSIS — I059 Rheumatic mitral valve disease, unspecified: Secondary | ICD-10-CM | POA: Diagnosis not present

## 2011-10-20 DIAGNOSIS — I658 Occlusion and stenosis of other precerebral arteries: Secondary | ICD-10-CM | POA: Diagnosis not present

## 2011-10-20 DIAGNOSIS — I369 Nonrheumatic tricuspid valve disorder, unspecified: Secondary | ICD-10-CM | POA: Diagnosis not present

## 2011-10-21 DIAGNOSIS — R51 Headache: Secondary | ICD-10-CM | POA: Diagnosis not present

## 2011-10-21 DIAGNOSIS — R930 Abnormal findings on diagnostic imaging of skull and head, not elsewhere classified: Secondary | ICD-10-CM | POA: Diagnosis not present

## 2011-10-21 DIAGNOSIS — H539 Unspecified visual disturbance: Secondary | ICD-10-CM | POA: Diagnosis not present

## 2011-12-25 DIAGNOSIS — R1310 Dysphagia, unspecified: Secondary | ICD-10-CM | POA: Diagnosis not present

## 2011-12-30 DIAGNOSIS — R51 Headache: Secondary | ICD-10-CM | POA: Diagnosis not present

## 2011-12-30 DIAGNOSIS — R1310 Dysphagia, unspecified: Secondary | ICD-10-CM | POA: Diagnosis not present

## 2011-12-30 DIAGNOSIS — K2100 Gastro-esophageal reflux disease with esophagitis, without bleeding: Secondary | ICD-10-CM | POA: Diagnosis not present

## 2012-03-16 DIAGNOSIS — H269 Unspecified cataract: Secondary | ICD-10-CM | POA: Diagnosis not present

## 2012-03-16 DIAGNOSIS — H35039 Hypertensive retinopathy, unspecified eye: Secondary | ICD-10-CM | POA: Diagnosis not present

## 2012-04-15 DIAGNOSIS — Z23 Encounter for immunization: Secondary | ICD-10-CM | POA: Diagnosis not present

## 2012-11-02 DIAGNOSIS — M76899 Other specified enthesopathies of unspecified lower limb, excluding foot: Secondary | ICD-10-CM | POA: Diagnosis not present

## 2012-11-02 DIAGNOSIS — E782 Mixed hyperlipidemia: Secondary | ICD-10-CM | POA: Diagnosis not present

## 2012-11-02 DIAGNOSIS — Z0000 Encounter for general adult medical examination without abnormal findings: Secondary | ICD-10-CM | POA: Diagnosis not present

## 2012-11-03 DIAGNOSIS — E7800 Pure hypercholesterolemia, unspecified: Secondary | ICD-10-CM | POA: Diagnosis not present

## 2012-11-03 DIAGNOSIS — I10 Essential (primary) hypertension: Secondary | ICD-10-CM | POA: Diagnosis not present

## 2012-11-03 DIAGNOSIS — M76899 Other specified enthesopathies of unspecified lower limb, excluding foot: Secondary | ICD-10-CM | POA: Diagnosis not present

## 2012-11-16 DIAGNOSIS — I10 Essential (primary) hypertension: Secondary | ICD-10-CM | POA: Diagnosis not present

## 2012-11-16 DIAGNOSIS — M76899 Other specified enthesopathies of unspecified lower limb, excluding foot: Secondary | ICD-10-CM | POA: Diagnosis not present

## 2012-11-16 DIAGNOSIS — G894 Chronic pain syndrome: Secondary | ICD-10-CM | POA: Diagnosis not present

## 2012-11-16 DIAGNOSIS — M545 Low back pain: Secondary | ICD-10-CM | POA: Diagnosis not present

## 2012-11-16 DIAGNOSIS — E782 Mixed hyperlipidemia: Secondary | ICD-10-CM | POA: Diagnosis not present

## 2013-01-19 DIAGNOSIS — R413 Other amnesia: Secondary | ICD-10-CM | POA: Diagnosis not present

## 2013-01-19 DIAGNOSIS — R5381 Other malaise: Secondary | ICD-10-CM | POA: Diagnosis not present

## 2013-01-19 DIAGNOSIS — Z79899 Other long term (current) drug therapy: Secondary | ICD-10-CM | POA: Diagnosis not present

## 2013-02-17 DIAGNOSIS — M76899 Other specified enthesopathies of unspecified lower limb, excluding foot: Secondary | ICD-10-CM | POA: Diagnosis not present

## 2013-02-17 DIAGNOSIS — I10 Essential (primary) hypertension: Secondary | ICD-10-CM | POA: Diagnosis not present

## 2013-02-17 DIAGNOSIS — M545 Low back pain: Secondary | ICD-10-CM | POA: Diagnosis not present

## 2013-02-17 DIAGNOSIS — M47817 Spondylosis without myelopathy or radiculopathy, lumbosacral region: Secondary | ICD-10-CM | POA: Diagnosis not present

## 2013-02-17 DIAGNOSIS — M48061 Spinal stenosis, lumbar region without neurogenic claudication: Secondary | ICD-10-CM | POA: Diagnosis not present

## 2013-02-23 DIAGNOSIS — E538 Deficiency of other specified B group vitamins: Secondary | ICD-10-CM | POA: Diagnosis not present

## 2013-02-23 DIAGNOSIS — E782 Mixed hyperlipidemia: Secondary | ICD-10-CM | POA: Diagnosis not present

## 2013-02-23 DIAGNOSIS — R0602 Shortness of breath: Secondary | ICD-10-CM | POA: Diagnosis not present

## 2013-02-23 DIAGNOSIS — R5381 Other malaise: Secondary | ICD-10-CM | POA: Diagnosis not present

## 2013-02-23 DIAGNOSIS — G43009 Migraine without aura, not intractable, without status migrainosus: Secondary | ICD-10-CM | POA: Diagnosis not present

## 2013-02-23 DIAGNOSIS — I10 Essential (primary) hypertension: Secondary | ICD-10-CM | POA: Diagnosis not present

## 2013-02-23 DIAGNOSIS — M545 Low back pain: Secondary | ICD-10-CM | POA: Diagnosis not present

## 2013-02-23 DIAGNOSIS — E7200 Disorders of amino-acid transport, unspecified: Secondary | ICD-10-CM | POA: Diagnosis not present

## 2013-02-23 DIAGNOSIS — R1319 Other dysphagia: Secondary | ICD-10-CM | POA: Diagnosis not present

## 2013-02-28 DIAGNOSIS — M47817 Spondylosis without myelopathy or radiculopathy, lumbosacral region: Secondary | ICD-10-CM | POA: Diagnosis not present

## 2013-03-02 DIAGNOSIS — M47817 Spondylosis without myelopathy or radiculopathy, lumbosacral region: Secondary | ICD-10-CM | POA: Diagnosis not present

## 2013-03-02 DIAGNOSIS — IMO0002 Reserved for concepts with insufficient information to code with codable children: Secondary | ICD-10-CM | POA: Diagnosis not present

## 2013-03-03 DIAGNOSIS — R1310 Dysphagia, unspecified: Secondary | ICD-10-CM | POA: Diagnosis not present

## 2013-03-03 DIAGNOSIS — R0609 Other forms of dyspnea: Secondary | ICD-10-CM | POA: Diagnosis not present

## 2013-03-03 DIAGNOSIS — R0989 Other specified symptoms and signs involving the circulatory and respiratory systems: Secondary | ICD-10-CM | POA: Diagnosis not present

## 2013-03-03 DIAGNOSIS — K219 Gastro-esophageal reflux disease without esophagitis: Secondary | ICD-10-CM | POA: Diagnosis not present

## 2013-03-04 ENCOUNTER — Encounter: Payer: Self-pay | Admitting: Internal Medicine

## 2013-03-08 DIAGNOSIS — K219 Gastro-esophageal reflux disease without esophagitis: Secondary | ICD-10-CM | POA: Diagnosis not present

## 2013-03-08 DIAGNOSIS — R1310 Dysphagia, unspecified: Secondary | ICD-10-CM | POA: Diagnosis not present

## 2013-03-28 ENCOUNTER — Encounter (HOSPITAL_COMMUNITY): Payer: Self-pay | Admitting: Pharmacy Technician

## 2013-03-29 NOTE — H&P (Signed)
Jessica Gould is an 67 y.o. female.   Chief Complaint: back and left leg pain HPI: Pt had an episode of back pain that started about several months ago with severe pain with bending, difficulty straightening her back up.  It occurred when she was just reaching for some items on a shelf while grocery shopping and then experienced severe pain.  She did have discomfort into her right lower extremity and into her buttock along the right upper buttock and right lateral thigh, associated numbness and paresthesias.  She has been having difficulties with sleeping. treated with a Medrol Dosepak which seemed to help some.  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  The surgery was completed, and postoperatively she had problems with severe pain and discomfort.  Followup CT scan suggested some impingement on the neural foramen on the right side.  She underwent removal of hardware with immediate relief of pain.  She has since had some ongoing pain and discomfort and relates that she has had intermittent catching in her back.  At her last workup of this,there was  concern that she may have some impingement with the remaining left-sided posterior hardware at the L4-5 level.  The rod itself on the left side appeared to be extending downward and may impinge on the left side lumbosacral facet with extension.  She has continued having problems with episodic discomfort now and concerns with pain into the right lower extremity.   RADIOGRAPHS:  The results of her MRI study indicate a bulging disk at L1-2 without stenosis and an anterior bulge circumferentially without stenosis with mild arthrosis at L2-3 and L3-4.  Mild disk space loss with moderate circumferential disk bulge and moderate enlargement of facet joints and ligaments greater on the left.  Mild left lateral recess  stenosis.  At 4-5 widely patent thecal sac without stenosis status post fusion.  She has residual hardware remaining along the left side at the L4-5 level. Concerns  that the patient's hardware along the left side may very well impinge on the lumbosacral facet joint with extension.  Her plain radiographs from 02/17/2013 show an abundant posterior lateral fusion and interbody fusion appears solid at the L4-5 level.  Some mild degenerative disk narrowing at L2-3 and 3-4 above her fusion site.  Plan for removal of hardware at the L4-5 level with a left-sided L3-4 lateral recess decompression.  The risks of surgery including infection, bleeding and neurologic compromise discussed with her.    Past Medical History  Diagnosis Date  . Sleep apnea     do not use CPAP  . GERD (gastroesophageal reflux disease)   . Headache(784.0)   . Arthritis   . PONV (postoperative nausea and vomiting)     BP dropprd once    Past Surgical History  Procedure Laterality Date  . Esophageal dilation  03/31/2013  . Tonsillectomy Right     nodals removed  . Back surgery  2004    lumbar  . Tubal ligation    . Diagnostic laparoscopy    . Cholecystectomy    . Appendectomy    . Abdominal hysterectomy    . Upper gi endoscopy      No family history on file. Social History:  reports that she has never smoked. She has never used smokeless tobacco. She reports that she does not drink alcohol or use  illicit drugs.  Allergies:  Allergies  Allergen Reactions  . Duloxetine     Cymbalta= unknown  . Erythromycin Nausea And Vomiting    Abdominal pain  . Other     "Cat Gut Sutures"= abscess  . Oxycontin [Oxycodone Hcl]     migraine headache  . Pregabalin     Lyrica= unknown  . Sulfamethoxazole-Trimethoprim Swelling    lips  . Penicillins Itching and Rash    Medications Prior to Admission  Medication Sig Dispense Refill  . Cyanocobalamin (VITAMIN B-12) 2500 MCG SUBL Place 2,500 mcg under the tongue daily.      Marland Kitchen  HYDROcodone-acetaminophen (NORCO) 10-325 MG per tablet Take 0.5-1 tablets by mouth every 6 (six) hours as needed for pain.      . pantoprazole (PROTONIX) 40 MG tablet Take 40 mg by mouth daily.        No results found for this or any previous visit (from the past 48 hour(s)). No results found.  Review of Systems  Constitutional: Negative.   HENT: Negative.   Eyes: Negative.   Respiratory: Negative.   Cardiovascular: Negative.   Gastrointestinal: Negative.   Genitourinary: Negative.   Musculoskeletal: Positive for back pain.  Skin: Negative.   Neurological: Positive for tingling, sensory change and focal weakness.       Left leg  Psychiatric/Behavioral: Negative.     Blood pressure 145/87, pulse 73, temperature 98.3 F (36.8 C), temperature source Oral, resp. rate 20, SpO2 95.00%. Physical Exam  Constitutional: She is oriented to person, place, and time. She appears well-developed and well-nourished.  HENT:  Head: Normocephalic and atraumatic.  Eyes: EOM are normal. Pupils are equal, round, and reactive to light.  Neck: Normal range of motion. Neck supple.  Cardiovascular: Normal rate, regular rhythm and intact distal pulses.   Respiratory: Effort normal and breath sounds normal.  GI: Soft.  Musculoskeletal:   reflexes at the knee are zero and symmetric, at the ankle zero and symmetric.  sciatic tension tests are negative, both sides.  Popliteal compression sign is negative. exam shows that  strength in both lower extremities is intact.  Forward bending fingertips to about the midcalf level.    Neurological: She is alert and oriented to person, place, and time.  Skin: Skin is warm and dry.  Psychiatric: She has a normal mood and affect.     Assessment/Plan Left L3-4 lateral recess stenosis, left L3 foraminal stenosis and retained rod and screw at left L4-5  PLAN:  Left L3-4 lateral recess decompression and left L3 foraminotomy.  Removal of rod and screws at Left  L4-5. Patient was seen and examined in the preop holding area. There has been no interval  Change in this patient's exam preop  history and physical exam  Lab tests and images have been examined and reviewed.  The Risks benefits and alternative treatments have been discussed  extensively,questions answered.  The patient has elected to undergo the discussed surgical treatment. Sophy Mesler E 04/04/2013, 7:13 AM

## 2013-03-30 DIAGNOSIS — Z7982 Long term (current) use of aspirin: Secondary | ICD-10-CM | POA: Diagnosis not present

## 2013-03-30 DIAGNOSIS — Q391 Atresia of esophagus with tracheo-esophageal fistula: Secondary | ICD-10-CM | POA: Diagnosis not present

## 2013-03-30 DIAGNOSIS — K449 Diaphragmatic hernia without obstruction or gangrene: Secondary | ICD-10-CM | POA: Diagnosis not present

## 2013-03-30 DIAGNOSIS — K219 Gastro-esophageal reflux disease without esophagitis: Secondary | ICD-10-CM | POA: Diagnosis not present

## 2013-03-30 DIAGNOSIS — F341 Dysthymic disorder: Secondary | ICD-10-CM | POA: Diagnosis not present

## 2013-03-30 DIAGNOSIS — Z79899 Other long term (current) drug therapy: Secondary | ICD-10-CM | POA: Diagnosis not present

## 2013-03-30 DIAGNOSIS — K222 Esophageal obstruction: Secondary | ICD-10-CM | POA: Diagnosis not present

## 2013-03-30 DIAGNOSIS — E785 Hyperlipidemia, unspecified: Secondary | ICD-10-CM | POA: Diagnosis not present

## 2013-03-30 DIAGNOSIS — R1310 Dysphagia, unspecified: Secondary | ICD-10-CM | POA: Diagnosis not present

## 2013-03-31 ENCOUNTER — Encounter (HOSPITAL_COMMUNITY): Payer: Self-pay

## 2013-03-31 ENCOUNTER — Ambulatory Visit (HOSPITAL_COMMUNITY)
Admission: RE | Admit: 2013-03-31 | Discharge: 2013-03-31 | Disposition: A | Discharge status: Home / Self Care | Payer: Medicare Other | Source: Ambulatory Visit | Attending: Anesthesiology | Admitting: Anesthesiology

## 2013-03-31 ENCOUNTER — Encounter (HOSPITAL_COMMUNITY)
Admission: RE | Admit: 2013-03-31 | Discharge: 2013-03-31 | Disposition: A | Discharge status: Home / Self Care | Payer: Medicare Other | Source: Ambulatory Visit | Attending: Specialist | Admitting: Specialist

## 2013-03-31 DIAGNOSIS — Z01810 Encounter for preprocedural cardiovascular examination: Secondary | ICD-10-CM | POA: Diagnosis not present

## 2013-03-31 DIAGNOSIS — Z01818 Encounter for other preprocedural examination: Secondary | ICD-10-CM | POA: Insufficient documentation

## 2013-03-31 DIAGNOSIS — Z01812 Encounter for preprocedural laboratory examination: Secondary | ICD-10-CM | POA: Diagnosis not present

## 2013-03-31 DIAGNOSIS — Z01811 Encounter for preprocedural respiratory examination: Secondary | ICD-10-CM | POA: Diagnosis not present

## 2013-03-31 HISTORY — PX: ESOPHAGEAL DILATION: SHX303

## 2013-03-31 HISTORY — DX: Gastro-esophageal reflux disease without esophagitis: K21.9

## 2013-03-31 HISTORY — DX: Other specified postprocedural states: Z98.890

## 2013-03-31 HISTORY — DX: Headache: R51

## 2013-03-31 HISTORY — DX: Nausea with vomiting, unspecified: R11.2

## 2013-03-31 HISTORY — DX: Sleep apnea, unspecified: G47.30

## 2013-03-31 HISTORY — DX: Unspecified osteoarthritis, unspecified site: M19.90

## 2013-03-31 LAB — URINE MICROSCOPIC-ADD ON

## 2013-03-31 LAB — URINALYSIS, ROUTINE W REFLEX MICROSCOPIC
Glucose, UA: NEGATIVE mg/dL
Ketones, ur: 15 mg/dL — AB
Nitrite: NEGATIVE
Protein, ur: NEGATIVE mg/dL
Specific Gravity, Urine: 1.027 (ref 1.005–1.030)

## 2013-03-31 LAB — SURGICAL PCR SCREEN: Staphylococcus aureus: NEGATIVE

## 2013-03-31 LAB — COMPREHENSIVE METABOLIC PANEL
ALT: 22 U/L (ref 0–35)
Albumin: 4.1 g/dL (ref 3.5–5.2)
Alkaline Phosphatase: 102 U/L (ref 39–117)
BUN: 16 mg/dL (ref 6–23)
Calcium: 9.6 mg/dL (ref 8.4–10.5)
Chloride: 101 mEq/L (ref 96–112)
GFR calc Af Amer: >90 mL/min (ref >90)
Glucose, Bld: 94 mg/dL (ref 70–99)
Potassium: 3.7 mEq/L (ref 3.5–5.1)
Sodium: 140 mEq/L (ref 135–145)
Total Bilirubin: 0.3 mg/dL (ref 0.3–1.2)

## 2013-03-31 LAB — CBC
HCT: 46.7 % — ABNORMAL HIGH (ref 36.0–46.0)
Hemoglobin: 16 g/dL — ABNORMAL HIGH (ref 12.0–15.0)
MCHC: 34.3 g/dL (ref 30.0–36.0)
MCV: 92.5 fL (ref 78.0–100.0)
RBC: 5.05 MIL/uL (ref 3.87–5.11)
RDW: 13 % (ref 11.5–15.5)
WBC: 8.8 10*3/uL (ref 4.0–10.5)

## 2013-03-31 MED ORDER — CHLORHEXIDINE GLUCONATE 4 % EX LIQD: 60.0000 mL | Freq: Once | CUTANEOUS | Status: DC

## 2013-03-31 NOTE — Pre-Procedure Instructions (Signed)
SOPHIAGRACE BENBROOK  03/31/2013   Your procedure is scheduled on: Monday November 10,2014 at 0730 AM. Report to [facility] Main Entrance "A"at 0530 AM.  Call this number if you have problems the morning of surgery: [phone]   Remember:   Do not eat food or drink liquids after midnight.   Take these medicines the morning of surgery with A SIP OF WATER: Hydrocodone-acetaminophen if needed for pain and Protonix   Do not wear jewelry, make-up or nail polish.  Do not wear lotions, powders, or perfumes. You may wear deodorant.  Do not shave 48 hours prior to surgery.   Do not bring valuables to the hospital.  [facility] is not responsible  for any belongings or valuables.               Contacts, dentures or bridgework may not be worn into surgery.  Leave suitcase in the car. After surgery it may be brought to your room.  For patients admitted to the hospital, discharge time is determined by your      treatment team.               Patients discharged the day of surgery will not be allowed to drive home      Special Instructions: Shower using CHG 2 nights before surgery and the night before surgery.  If you shower the day of surgery use CHG.  Use special wash - you have one bottle of CHG for all showers.  You should use approximately 1/3 of the bottle for each shower.   Please read over the following fact sheets that you were given: Pain Booklet, Coughing and Deep Breathing, MRSA Information and Surgical Site Infection Prevention

## 2013-04-02 LAB — URINE CULTURE: Colony Count: 50000

## 2013-04-03 MED ORDER — VANCOMYCIN HCL IN DEXTROSE 1-5 GM/200ML-% IV SOLN
1000.0000 mg | INTRAVENOUS | Status: AC
  Administered 2013-04-04: 1000 mg via INTRAVENOUS
  Filled 2013-04-03: qty 200

## 2013-04-04 ENCOUNTER — Encounter (HOSPITAL_COMMUNITY): Payer: Medicare Other | Admitting: Vascular Surgery

## 2013-04-04 ENCOUNTER — Inpatient Hospital Stay (HOSPITAL_COMMUNITY): Payer: Medicare Other

## 2013-04-04 ENCOUNTER — Inpatient Hospital Stay (HOSPITAL_COMMUNITY)
Admission: RE | Admit: 2013-04-04 | Discharge: 2013-04-06 | DRG: 520 | Disposition: A | Discharge status: Home Health | Payer: Medicare Other | Source: Ambulatory Visit | Attending: Specialist | Admitting: Specialist

## 2013-04-04 ENCOUNTER — Encounter (HOSPITAL_COMMUNITY): Payer: Self-pay | Admitting: Anesthesiology

## 2013-04-04 ENCOUNTER — Encounter (HOSPITAL_COMMUNITY)
Admission: RE | Disposition: A | Discharge status: Home Health | Payer: Self-pay | Source: Ambulatory Visit | Attending: Specialist

## 2013-04-04 ENCOUNTER — Inpatient Hospital Stay (HOSPITAL_COMMUNITY): Payer: Medicare Other | Admitting: Anesthesiology

## 2013-04-04 DIAGNOSIS — IMO0002 Reserved for concepts with insufficient information to code with codable children: Secondary | ICD-10-CM | POA: Diagnosis not present

## 2013-04-04 DIAGNOSIS — M48061 Spinal stenosis, lumbar region without neurogenic claudication: Secondary | ICD-10-CM | POA: Diagnosis not present

## 2013-04-04 DIAGNOSIS — G4730 Sleep apnea, unspecified: Secondary | ICD-10-CM | POA: Diagnosis not present

## 2013-04-04 DIAGNOSIS — K219 Gastro-esophageal reflux disease without esophagitis: Secondary | ICD-10-CM | POA: Diagnosis not present

## 2013-04-04 DIAGNOSIS — M129 Arthropathy, unspecified: Secondary | ICD-10-CM | POA: Diagnosis present

## 2013-04-04 DIAGNOSIS — T84498A Other mechanical complication of other internal orthopedic devices, implants and grafts, initial encounter: Secondary | ICD-10-CM | POA: Diagnosis not present

## 2013-04-04 DIAGNOSIS — M545 Low back pain, unspecified: Secondary | ICD-10-CM | POA: Diagnosis not present

## 2013-04-04 DIAGNOSIS — M549 Dorsalgia, unspecified: Secondary | ICD-10-CM | POA: Diagnosis not present

## 2013-04-04 DIAGNOSIS — M539 Dorsopathy, unspecified: Secondary | ICD-10-CM | POA: Diagnosis not present

## 2013-04-04 DIAGNOSIS — T8489XA Other specified complication of internal orthopedic prosthetic devices, implants and grafts, initial encounter: Secondary | ICD-10-CM | POA: Diagnosis not present

## 2013-04-04 DIAGNOSIS — Z981 Arthrodesis status: Secondary | ICD-10-CM

## 2013-04-04 DIAGNOSIS — M47817 Spondylosis without myelopathy or radiculopathy, lumbosacral region: Secondary | ICD-10-CM | POA: Diagnosis not present

## 2013-04-04 HISTORY — PX: LUMBAR LAMINECTOMY: SHX95

## 2013-04-04 HISTORY — PX: HARDWARE REMOVAL: SHX979

## 2013-04-04 SURGERY — MICRODISCECTOMY LUMBAR LAMINECTOMY
Anesthesia: General | Site: Spine Lumbar | Wound class: Clean

## 2013-04-04 MED ORDER — MIDAZOLAM HCL 5 MG/5ML IJ SOLN
INTRAMUSCULAR | Status: DC | PRN
  Administered 2013-04-04 (×2): 1 mg via INTRAVENOUS

## 2013-04-04 MED ORDER — HYDROMORPHONE HCL PF 1 MG/ML IJ SOLN
INTRAMUSCULAR | Status: AC
  Filled 2013-04-04: qty 1

## 2013-04-04 MED ORDER — METOCLOPRAMIDE HCL 5 MG/ML IJ SOLN
10.0000 mg | Freq: Four times a day (QID) | INTRAMUSCULAR | Status: DC | PRN
  Administered 2013-04-04: 10 mg via INTRAVENOUS
  Filled 2013-04-04: qty 2

## 2013-04-04 MED ORDER — HYDROMORPHONE HCL PF 1 MG/ML IJ SOLN
0.2500 mg | INTRAMUSCULAR | Status: DC | PRN
  Administered 2013-04-04 (×3): 0.5 mg via INTRAVENOUS

## 2013-04-04 MED ORDER — 0.9 % SODIUM CHLORIDE (POUR BTL) OPTIME
TOPICAL | Status: DC | PRN
  Administered 2013-04-04: 1000 mL

## 2013-04-04 MED ORDER — MORPHINE SULFATE 2 MG/ML IJ SOLN
1.0000 mg | INTRAMUSCULAR | Status: DC | PRN
  Administered 2013-04-04 (×2): 2 mg via INTRAVENOUS
  Filled 2013-04-04 (×2): qty 1

## 2013-04-04 MED ORDER — OXYCODONE HCL 5 MG/5ML PO SOLN: 5.0000 mg | Freq: Once | ORAL | Status: AC | PRN

## 2013-04-04 MED ORDER — VECURONIUM BROMIDE 10 MG IV SOLR
INTRAVENOUS | Status: DC | PRN
  Administered 2013-04-04 (×2): 1 mg via INTRAVENOUS

## 2013-04-04 MED ORDER — PROPOFOL 10 MG/ML IV BOLUS
INTRAVENOUS | Status: DC | PRN
  Administered 2013-04-04: 50 mg via INTRAVENOUS
  Administered 2013-04-04: 150 mg via INTRAVENOUS

## 2013-04-04 MED ORDER — ONDANSETRON HCL 4 MG/2ML IJ SOLN
INTRAMUSCULAR | Status: DC | PRN
  Administered 2013-04-04: 4 mg via INTRAVENOUS

## 2013-04-04 MED ORDER — POTASSIUM CHLORIDE IN NACL 20-0.9 MEQ/L-% IV SOLN
INTRAVENOUS | Status: DC
  Administered 2013-04-05: 05:00:00 via INTRAVENOUS
  Filled 2013-04-04 (×7): qty 1000

## 2013-04-04 MED ORDER — VANCOMYCIN HCL IN DEXTROSE 1-5 GM/200ML-% IV SOLN
1000.0000 mg | Freq: Once | INTRAVENOUS | Status: AC
  Administered 2013-04-04: 1000 mg via INTRAVENOUS
  Filled 2013-04-04: qty 200

## 2013-04-04 MED ORDER — ALUM & MAG HYDROXIDE-SIMETH 200-200-20 MG/5ML PO SUSP
30.0000 mL | Freq: Four times a day (QID) | ORAL | Status: DC | PRN
  Administered 2013-04-04: 30 mL via ORAL
  Filled 2013-04-04: qty 30

## 2013-04-04 MED ORDER — MEPERIDINE HCL 25 MG/ML IJ SOLN: 6.2500 mg | INTRAMUSCULAR | Status: DC | PRN

## 2013-04-04 MED ORDER — METHOCARBAMOL 500 MG PO TABS
500.0000 mg | ORAL_TABLET | Freq: Four times a day (QID) | ORAL | Status: DC | PRN
  Administered 2013-04-04 – 2013-04-06 (×7): 500 mg via ORAL
  Filled 2013-04-04 (×6): qty 1

## 2013-04-04 MED ORDER — PHENYLEPHRINE HCL 10 MG/ML IJ SOLN
10.0000 mg | INTRAVENOUS | Status: DC | PRN
  Administered 2013-04-04: 25 ug/min via INTRAVENOUS

## 2013-04-04 MED ORDER — BISACODYL 10 MG RE SUPP: 10.0000 mg | Freq: Every day | RECTAL | Status: DC | PRN

## 2013-04-04 MED ORDER — THROMBIN 20000 UNITS EX KIT
PACK | CUTANEOUS | Status: DC | PRN
  Administered 2013-04-04: 08:00:00 via TOPICAL

## 2013-04-04 MED ORDER — PANTOPRAZOLE SODIUM 40 MG PO TBEC
40.0000 mg | DELAYED_RELEASE_TABLET | Freq: Every day | ORAL | Status: DC
  Administered 2013-04-05 – 2013-04-06 (×2): 40 mg via ORAL
  Filled 2013-04-04 (×2): qty 1

## 2013-04-04 MED ORDER — METOCLOPRAMIDE HCL 5 MG/ML IJ SOLN: 10.0000 mg | Freq: Four times a day (QID) | INTRAMUSCULAR | Status: DC

## 2013-04-04 MED ORDER — SODIUM CHLORIDE 0.9 % IJ SOLN
3.0000 mL | Freq: Two times a day (BID) | INTRAMUSCULAR | Status: DC
  Administered 2013-04-05 – 2013-04-06 (×2): 3 mL via INTRAVENOUS

## 2013-04-04 MED ORDER — ACETAMINOPHEN 650 MG RE SUPP: 650.0000 mg | RECTAL | Status: DC | PRN

## 2013-04-04 MED ORDER — MENTHOL 3 MG MT LOZG: 1.0000 | LOZENGE | OROMUCOSAL | Status: DC | PRN

## 2013-04-04 MED ORDER — POLYETHYLENE GLYCOL 3350 17 G PO PACK
17.0000 g | PACK | Freq: Every day | ORAL | Status: DC | PRN
  Administered 2013-04-05: 17 g via ORAL
  Filled 2013-04-04: qty 1

## 2013-04-04 MED ORDER — METOCLOPRAMIDE HCL 10 MG PO TABS: 10.0000 mg | ORAL_TABLET | Freq: Four times a day (QID) | ORAL | Status: DC | PRN

## 2013-04-04 MED ORDER — FLEET ENEMA 7-19 GM/118ML RE ENEM: 1.0000 | ENEMA | Freq: Once | RECTAL | Status: AC | PRN

## 2013-04-04 MED ORDER — ROCURONIUM BROMIDE 100 MG/10ML IV SOLN
INTRAVENOUS | Status: DC | PRN
  Administered 2013-04-04: 50 mg via INTRAVENOUS

## 2013-04-04 MED ORDER — FENTANYL CITRATE 0.05 MG/ML IJ SOLN
INTRAMUSCULAR | Status: DC | PRN
  Administered 2013-04-04 (×3): 50 ug via INTRAVENOUS
  Administered 2013-04-04: 150 ug via INTRAVENOUS
  Administered 2013-04-04 (×2): 50 ug via INTRAVENOUS

## 2013-04-04 MED ORDER — BUPIVACAINE-EPINEPHRINE 0.5% -1:200000 IJ SOLN
INTRAMUSCULAR | Status: DC | PRN
  Administered 2013-04-04: 10 mL

## 2013-04-04 MED ORDER — PHENOL 1.4 % MT LIQD: 1.0000 | OROMUCOSAL | Status: DC | PRN

## 2013-04-04 MED ORDER — PROMETHAZINE HCL 25 MG/ML IJ SOLN: 6.2500 mg | INTRAMUSCULAR | Status: DC | PRN

## 2013-04-04 MED ORDER — PHENYLEPHRINE HCL 10 MG/ML IJ SOLN
INTRAMUSCULAR | Status: DC | PRN
  Administered 2013-04-04 (×3): 80 ug via INTRAVENOUS

## 2013-04-04 MED ORDER — METHOCARBAMOL 500 MG PO TABS
ORAL_TABLET | ORAL | Status: AC
  Administered 2013-04-04: 12:00:00
  Filled 2013-04-04: qty 1

## 2013-04-04 MED ORDER — SCOPOLAMINE 1 MG/3DAYS TD PT72: 1.0000 | MEDICATED_PATCH | TRANSDERMAL | Status: DC

## 2013-04-04 MED ORDER — GLYCOPYRROLATE 0.2 MG/ML IJ SOLN
INTRAMUSCULAR | Status: DC | PRN
  Administered 2013-04-04: .8 mg via INTRAVENOUS

## 2013-04-04 MED ORDER — LACTATED RINGERS IV SOLN
INTRAVENOUS | Status: DC | PRN
  Administered 2013-04-04 (×2): via INTRAVENOUS

## 2013-04-04 MED ORDER — DOCUSATE SODIUM 100 MG PO CAPS
100.0000 mg | ORAL_CAPSULE | Freq: Two times a day (BID) | ORAL | Status: DC
  Administered 2013-04-04 – 2013-04-06 (×4): 100 mg via ORAL
  Filled 2013-04-04 (×5): qty 1

## 2013-04-04 MED ORDER — DIPHENHYDRAMINE HCL 25 MG PO CAPS
25.0000 mg | ORAL_CAPSULE | Freq: Four times a day (QID) | ORAL | Status: DC | PRN
  Filled 2013-04-04: qty 1

## 2013-04-04 MED ORDER — SODIUM CHLORIDE 0.9 % IV SOLN: 250.0000 mL | INTRAVENOUS | Status: DC

## 2013-04-04 MED ORDER — HYDROCODONE-ACETAMINOPHEN 5-325 MG PO TABS
1.0000 | ORAL_TABLET | ORAL | Status: DC | PRN
  Administered 2013-04-04 – 2013-04-06 (×11): 2 via ORAL
  Filled 2013-04-04 (×12): qty 2

## 2013-04-04 MED ORDER — EPHEDRINE SULFATE 50 MG/ML IJ SOLN
INTRAMUSCULAR | Status: DC | PRN
  Administered 2013-04-04: 15 mg via INTRAVENOUS

## 2013-04-04 MED ORDER — THROMBIN 20000 UNITS EX SOLR
CUTANEOUS | Status: AC
  Filled 2013-04-04: qty 20000

## 2013-04-04 MED ORDER — NEOSTIGMINE METHYLSULFATE 1 MG/ML IJ SOLN
INTRAMUSCULAR | Status: DC | PRN
  Administered 2013-04-04: 4 mg via INTRAVENOUS

## 2013-04-04 MED ORDER — LIDOCAINE HCL (CARDIAC) 20 MG/ML IV SOLN
INTRAVENOUS | Status: DC | PRN
  Administered 2013-04-04: 20 mg via INTRAVENOUS

## 2013-04-04 MED ORDER — SCOPOLAMINE 1 MG/3DAYS TD PT72
1.0000 | MEDICATED_PATCH | TRANSDERMAL | Status: DC
  Administered 2013-04-04: 1 via TRANSDERMAL

## 2013-04-04 MED ORDER — OXYCODONE HCL 5 MG PO TABS
5.0000 mg | ORAL_TABLET | Freq: Once | ORAL | Status: AC | PRN
  Administered 2013-04-04: 5 mg via ORAL

## 2013-04-04 MED ORDER — SODIUM CHLORIDE 0.9 % IJ SOLN: 3.0000 mL | INTRAMUSCULAR | Status: DC | PRN

## 2013-04-04 MED ORDER — ACETAMINOPHEN 325 MG PO TABS: 650.0000 mg | ORAL_TABLET | ORAL | Status: DC | PRN

## 2013-04-04 MED ORDER — BUPIVACAINE-EPINEPHRINE (PF) 0.5% -1:200000 IJ SOLN
INTRAMUSCULAR | Status: AC
  Filled 2013-04-04: qty 10

## 2013-04-04 MED ORDER — METHOCARBAMOL 100 MG/ML IJ SOLN
500.0000 mg | Freq: Four times a day (QID) | INTRAVENOUS | Status: DC | PRN
  Filled 2013-04-04: qty 5

## 2013-04-04 MED ORDER — METOCLOPRAMIDE HCL 10 MG PO TABS: 10.0000 mg | ORAL_TABLET | Freq: Four times a day (QID) | ORAL | Status: DC

## 2013-04-04 MED ORDER — ONDANSETRON HCL 4 MG/2ML IJ SOLN
4.0000 mg | INTRAMUSCULAR | Status: DC | PRN
  Administered 2013-04-04: 4 mg via INTRAVENOUS
  Filled 2013-04-04: qty 2

## 2013-04-04 MED ORDER — ARTIFICIAL TEARS OP OINT
TOPICAL_OINTMENT | OPHTHALMIC | Status: DC | PRN
  Administered 2013-04-04: 1 via OPHTHALMIC

## 2013-04-04 MED ORDER — OXYCODONE HCL 5 MG PO TABS
ORAL_TABLET | ORAL | Status: AC
  Administered 2013-04-04: 12:00:00
  Filled 2013-04-04: qty 1

## 2013-04-04 MED ORDER — HYDROMORPHONE HCL PF 1 MG/ML IJ SOLN
INTRAMUSCULAR | Status: AC
  Administered 2013-04-04: 0.5 mg via INTRAVENOUS
  Filled 2013-04-04: qty 1

## 2013-04-04 SURGICAL SUPPLY — 84 items
ADH SKN CLS APL DERMABOND .7 (GAUZE/BANDAGES/DRESSINGS) ×2
BANDAGE ELASTIC 4 VELCRO ST LF (GAUZE/BANDAGES/DRESSINGS) IMPLANT
BANDAGE ELASTIC 6 VELCRO ST LF (GAUZE/BANDAGES/DRESSINGS) IMPLANT
BANDAGE ESMARK 6X9 LF (GAUZE/BANDAGES/DRESSINGS) IMPLANT
BANDAGE GAUZE ELAST BULKY 4 IN (GAUZE/BANDAGES/DRESSINGS) ×2 IMPLANT
BNDG CMPR 9X6 STRL LF SNTH (GAUZE/BANDAGES/DRESSINGS)
BNDG COHESIVE 4X5 TAN STRL (GAUZE/BANDAGES/DRESSINGS) IMPLANT
BNDG ESMARK 6X9 LF (GAUZE/BANDAGES/DRESSINGS)
BUR RND FLUTED 2.5 (BURR) IMPLANT
BUR ROUND FLUTED 4 SOFT TCH (BURR) ×2 IMPLANT
BUR SABER RD CUTTING 3.0 (BURR) ×1 IMPLANT
CANISTER SUCTION 2500CC (MISCELLANEOUS) ×3 IMPLANT
CLOTH BEACON ORANGE TIMEOUT ST (SAFETY) ×3 IMPLANT
CORDS BIPOLAR (ELECTRODE) ×3 IMPLANT
COVER MAYO STAND STRL (DRAPES) ×3 IMPLANT
COVER SURGICAL LIGHT HANDLE (MISCELLANEOUS) ×3 IMPLANT
CUFF TOURNIQUET SINGLE 18IN (TOURNIQUET CUFF) ×2 IMPLANT
CUFF TOURNIQUET SINGLE 24IN (TOURNIQUET CUFF) IMPLANT
CUFF TOURNIQUET SINGLE 34IN LL (TOURNIQUET CUFF) IMPLANT
CUFF TOURNIQUET SINGLE 44IN (TOURNIQUET CUFF) IMPLANT
DERMABOND ADVANCED (GAUZE/BANDAGES/DRESSINGS) ×1
DERMABOND ADVANCED .7 DNX12 (GAUZE/BANDAGES/DRESSINGS) ×2 IMPLANT
DRAPE C-ARM 42X72 X-RAY (DRAPES) ×3 IMPLANT
DRAPE EXTREMITY T 121X128X90 (DRAPE) IMPLANT
DRAPE INCISE IOBAN 66X45 STRL (DRAPES) IMPLANT
DRAPE MICROSCOPE LEICA (MISCELLANEOUS) ×3 IMPLANT
DRAPE ORTHO SPLIT 77X108 STRL (DRAPES)
DRAPE POUCH INSTRU U-SHP 10X18 (DRAPES) ×3 IMPLANT
DRAPE PROXIMA HALF (DRAPES) ×1 IMPLANT
DRAPE SURG 17X23 STRL (DRAPES) ×12 IMPLANT
DRAPE SURG ORHT 6 SPLT 77X108 (DRAPES) IMPLANT
DRAPE U-SHAPE 47X51 STRL (DRAPES) ×2 IMPLANT
DRSG EMULSION OIL 3X3 NADH (GAUZE/BANDAGES/DRESSINGS) ×2 IMPLANT
DRSG MEPILEX BORDER 4X4 (GAUZE/BANDAGES/DRESSINGS) IMPLANT
DRSG MEPILEX BORDER 4X8 (GAUZE/BANDAGES/DRESSINGS) ×1 IMPLANT
DRSG PAD ABDOMINAL 8X10 ST (GAUZE/BANDAGES/DRESSINGS) ×2 IMPLANT
DURAPREP 26ML APPLICATOR (WOUND CARE) ×3 IMPLANT
ELECT BLADE 4.0 EZ CLEAN MEGAD (MISCELLANEOUS) ×3
ELECT CAUTERY BLADE 6.4 (BLADE) ×3 IMPLANT
ELECT REM PT RETURN 9FT ADLT (ELECTROSURGICAL) ×3
ELECTRODE BLDE 4.0 EZ CLN MEGD (MISCELLANEOUS) ×2 IMPLANT
ELECTRODE REM PT RTRN 9FT ADLT (ELECTROSURGICAL) ×2 IMPLANT
GLOVE BIOGEL PI IND STRL 7.5 (GLOVE) ×2 IMPLANT
GLOVE BIOGEL PI INDICATOR 7.5 (GLOVE) ×1
GLOVE ECLIPSE 7.0 STRL STRAW (GLOVE) ×3 IMPLANT
GLOVE ECLIPSE 8.5 STRL (GLOVE) ×3 IMPLANT
GLOVE SURG 8.5 LATEX PF (GLOVE) ×3 IMPLANT
GOWN PREVENTION PLUS LG XLONG (DISPOSABLE) ×1 IMPLANT
GOWN PREVENTION PLUS XXLARGE (GOWN DISPOSABLE) ×3 IMPLANT
GOWN STRL NON-REIN LRG LVL3 (GOWN DISPOSABLE) ×9 IMPLANT
HYDROGEN PEROXIDE 16OZ (MISCELLANEOUS) ×1 IMPLANT
KIT BASIN OR (CUSTOM PROCEDURE TRAY) ×3 IMPLANT
KIT ROOM TURNOVER OR (KITS) ×3 IMPLANT
MANIFOLD NEPTUNE II (INSTRUMENTS) ×2 IMPLANT
NDL SPNL 18GX3.5 QUINCKE PK (NEEDLE) ×4 IMPLANT
NEEDLE 22X1 1/2 (OR ONLY) (NEEDLE) ×3 IMPLANT
NEEDLE SPNL 18GX3.5 QUINCKE PK (NEEDLE) IMPLANT
NS IRRIG 1000ML POUR BTL (IV SOLUTION) ×3 IMPLANT
PACK GENERAL/GYN (CUSTOM PROCEDURE TRAY) ×3 IMPLANT
PACK LAMINECTOMY ORTHO (CUSTOM PROCEDURE TRAY) ×3 IMPLANT
PAD ARMBOARD 7.5X6 YLW CONV (MISCELLANEOUS) ×6 IMPLANT
PAD CAST 4YDX4 CTTN HI CHSV (CAST SUPPLIES) ×2 IMPLANT
PADDING CAST COTTON 4X4 STRL (CAST SUPPLIES)
PATTIES SURGICAL .5 X.5 (GAUZE/BANDAGES/DRESSINGS) IMPLANT
PATTIES SURGICAL .75X.75 (GAUZE/BANDAGES/DRESSINGS) ×1 IMPLANT
SPONGE GAUZE 4X4 12PLY (GAUZE/BANDAGES/DRESSINGS) ×2 IMPLANT
SPONGE LAP 4X18 X RAY DECT (DISPOSABLE) ×1 IMPLANT
SPONGE SURGIFOAM ABS GEL 100 (HEMOSTASIS) ×1 IMPLANT
STAPLER VISISTAT 35W (STAPLE) ×2 IMPLANT
STOCKINETTE IMPERVIOUS 9X36 MD (GAUZE/BANDAGES/DRESSINGS) IMPLANT
SUT ETHILON 4 0 FS 1 (SUTURE) IMPLANT
SUT VIC AB 0 CT1 27 (SUTURE)
SUT VIC AB 0 CT1 27XBRD ANBCTR (SUTURE) IMPLANT
SUT VIC AB 1 CT1 27 (SUTURE)
SUT VIC AB 1 CT1 27XBRD ANBCTR (SUTURE) IMPLANT
SUT VIC AB 2-0 CT1 27 (SUTURE)
SUT VIC AB 2-0 CT1 TAPERPNT 27 (SUTURE) IMPLANT
SUT VICRYL 0 UR6 27IN ABS (SUTURE) IMPLANT
SUT VICRYL 4-0 PS2 18IN ABS (SUTURE) IMPLANT
SYR CONTROL 10ML LL (SYRINGE) ×3 IMPLANT
TOWEL OR 17X24 6PK STRL BLUE (TOWEL DISPOSABLE) ×3 IMPLANT
TOWEL OR 17X26 10 PK STRL BLUE (TOWEL DISPOSABLE) ×3 IMPLANT
TRAY FOLEY CATH 16FRSI W/METER (SET/KITS/TRAYS/PACK) ×1 IMPLANT
WATER STERILE IRR 1000ML POUR (IV SOLUTION) ×2 IMPLANT

## 2013-04-04 NOTE — Transfer of Care (Signed)
Immediate Anesthesia Transfer of Care Note  Patient: Jessica Gould  Procedure(s) Performed: Procedure(s): Left L3-4 lateral recess decompression and Left L3-4 foraminotomy, Removal of Rod and Screws Left L4-5 (N/A) HARDWARE REMOVAL (N/A)  Patient Location: PACU  Anesthesia Type:General  Level of Consciousness: awake, alert  and oriented  Airway & Oxygen Therapy: Patient Spontanous Breathing and Patient connected to nasal cannula oxygen  Post-op Assessment: Report given to PACU RN, Post -op Vital signs reviewed and stable and Patient moving all extremities X 4  Post vital signs: Reviewed and stable  Complications: No apparent anesthesia complications

## 2013-04-04 NOTE — Evaluation (Signed)
Physical Therapy Evaluation Patient Details Name: Jessica Gould MRN: [id number] DOB: 09/21/1945 Today's Date: 04/04/2013 Time: 4098-1191 PT Time Calculation (min): 23 min  PT Assessment / Plan / Recommendation History of Present Illness  Pt is a 67 y/o female admitted s/p left L3-L4 lateral recess decompression and left L3-L4 foraminotomy, with removal of rod and screws of left L4-5  Clinical Impression  This patient presents with acute pain and decreased functional independence following the above mentioned procedure. At the time of PT eval, pt required min guard for transfers and ambulation, however bed mobility was not assessed.  Plan is for pt to d/c home with daughter, as pt and husband live out of town. This patient is appropriate for skilled PT interventions to address functional limitations, improve safety and independence with functional mobility, and return to PLOF.     PT Assessment  Patient needs continued PT services    Follow Up Recommendations  Home health PT    Does the patient have the potential to tolerate intense rehabilitation      Barriers to Discharge        Equipment Recommendations  None recommended by PT    Recommendations for Other Services     Frequency Min 5X/week    Precautions / Restrictions Precautions Precautions: Fall;Back - Needs back precautions handout Restrictions Weight Bearing Restrictions: No   Pertinent Vitals/Pain Nausea/vomiting main complaints at time of PT eval.      Mobility  Bed Mobility Bed Mobility: Not assessed Details for Bed Mobility Assistance: Pt received sitting EOB Transfers Transfers: Sit to Stand;Stand to Sit Sit to Stand: 4: Min guard;From bed;With upper extremity assist Stand to Sit: 4: Min guard;To chair/3-in-1;With upper extremity assist Details for Transfer Assistance: VC's for hand placement and to maintain back precautions as she transitions to/from full stand.  Ambulation/Gait Ambulation/Gait  Assistance: 4: Min guard Ambulation Distance (Feet): 15 Feet Assistive device: Rolling walker Ambulation/Gait Assistance Details: VC's for back precautions, no physical assist required. Gait Pattern: Step-through pattern;Decreased stride length Gait velocity: Decreased Stairs: No    Exercises     PT Diagnosis: Difficulty walking;Acute pain  PT Problem List: Decreased strength;Decreased range of motion;Decreased activity tolerance;Decreased balance;Decreased mobility;Decreased knowledge of use of DME;Decreased safety awareness;Decreased knowledge of precautions;Pain PT Treatment Interventions: DME instruction;Gait training;Stair training;Functional mobility training;Therapeutic activities;Therapeutic exercise;Neuromuscular re-education;Patient/family education     PT Goals(Current goals can be found in the care plan section) Acute Rehab PT Goals Patient Stated Goal: To return home PT Goal Formulation: With patient/family Time For Goal Achievement: 04/11/13 Potential to Achieve Goals: Good  Visit Information  Last PT Received On: 04/04/13 Assistance Needed: +1 History of Present Illness: Pt is a 67 y/o female admitted s/p left L3-L4 lateral recess decompression and left L3-L4 foraminotomy, with removal of rod and screws of left L4-5       Prior Functioning  Home Living Family/patient expects to be discharged to:: Private residence Living Arrangements: Spouse/significant other (Will be going to daughter's home) Available Help at Discharge: Family;Available 24 hours/day Type of Home: House Home Access: Stairs to enter [employer] of Steps: 3 Entrance Stairs-Rails: None Home Layout: One level Home Equipment: Walker - 2 wheels;Bedside commode;Toilet riser Prior Function Level of Independence: Independent Communication Communication: No difficulties Dominant Hand: Right    Cognition  Cognition Arousal/Alertness: Awake/alert Behavior During Therapy: WFL for tasks  assessed/performed Overall Cognitive Status: Within Functional Limits for tasks assessed    Extremity/Trunk Assessment Upper Extremity Assessment Upper Extremity Assessment: Overall WFL for tasks  assessed Lower Extremity Assessment Lower Extremity Assessment: Overall WFL for tasks assessed Cervical / Trunk Assessment Cervical / Trunk Assessment: Normal   Balance Balance Balance Assessed: Yes Static Sitting Balance Static Sitting - Balance Support: Feet supported;No upper extremity supported Static Sitting - Level of Assistance: 5: Stand by assistance Static Sitting - Comment/# of Minutes: 5 minutes sitting EOB while history was taken Static Standing Balance Static Standing - Balance Support: Bilateral upper extremity supported Static Standing - Level of Assistance: 5: Stand by assistance Static Standing - Comment/# of Minutes: 30 seconds prior to initiating gait training.  End of Session PT - End of Session Equipment Utilized During Treatment: Gait belt Activity Tolerance: Patient tolerated treatment well Patient left: in chair;with call bell/phone within reach;with family/visitor present Nurse Communication: Mobility status  GP     Ruthann Cancer 04/04/2013, 4:18 PM  Ruthann Cancer, PT, DPT [phone]

## 2013-04-04 NOTE — Anesthesia Procedure Notes (Signed)
Procedure Name: Intubation Date/Time: 04/04/2013 7:49 AM Performed by: Gayla Medicus Pre-anesthesia Checklist: Patient identified, Timeout performed, Emergency Drugs available, Suction available and Patient being monitored Patient Re-evaluated:Patient Re-evaluated prior to inductionOxygen Delivery Method: Circle system utilized Preoxygenation: Pre-oxygenation with 100% oxygen Intubation Type: IV induction Ventilation: Two handed mask ventilation required and Oral airway inserted - appropriate to patient size Laryngoscope Size: Mac and 3 Grade View: Grade III Tube type: Oral Tube size: 7.5 mm Number of attempts: 3 Airway Equipment and Method: Stylet,  Video-laryngoscopy and Bougie stylet Placement Confirmation: ETT inserted through vocal cords under direct vision,  positive ETCO2 and breath sounds checked- equal and bilateral Secured at: 22 cm Tube secured with: Tape Dental Injury: Teeth and Oropharynx as per pre-operative assessment  Difficulty Due To: Difficulty was unanticipated Future Recommendations: Recommend- induction with short-acting agent, and alternative techniques readily available Comments: First DL by CRNA with Mac 3, grade IV view. Pt ventilated with two handed mask with 9cm OA. Second attempt by MDA with grade IV view. Pt masked again until glidescope arrived, grade III view with glidescope, pt intubated with 7.5 ETT, +BBSE, +ETCO2. All intubation attempts atraumatic, O2 sats maintained above 98% throughout induction. VSS. KH

## 2013-04-04 NOTE — Anesthesia Postprocedure Evaluation (Signed)
  Anesthesia Post-op Note  Patient: Jessica Gould  Procedure(s) Performed: Procedure(s): Left L3-4 lateral recess decompression and Left L3-4 foraminotomy, Removal of Rod and Screws Left L4-5 (N/A) HARDWARE REMOVAL (N/A)  Patient Location: PACU  Anesthesia Type:General  Level of Consciousness: awake, alert , oriented and patient cooperative  Airway and Oxygen Therapy: Patient Spontanous Breathing and Patient connected to nasal cannula oxygen  Post-op Pain: mild  Post-op Assessment: Post-op Vital signs reviewed, Patient's Cardiovascular Status Stable, Respiratory Function Stable, Patent Airway, No signs of Nausea or vomiting and Pain level controlled  Post-op Vital Signs: Reviewed and stable  Complications: No apparent anesthesia complications

## 2013-04-04 NOTE — Anesthesia Preprocedure Evaluation (Addendum)
Anesthesia Evaluation  Patient identified by MRN, date of birth, ID band Patient awake    Reviewed: Allergy & Precautions, H&P , NPO status , Patient's Chart, lab work & pertinent test results  History of Anesthesia Complications (+) PONV and history of anesthetic complications  Airway Mallampati: II TM Distance: >3 FB Neck ROM: Full    Dental  (+) Teeth Intact and Dental Advisory Given   Pulmonary sleep apnea ,  breath sounds clear to auscultation  Pulmonary exam normal       Cardiovascular hypertension (no meds presently), Rhythm:Regular Rate:Normal  '12 stress test: no ischemia, EF 65%   Neuro/Psych  Headaches, PSYCHIATRIC DISORDERS Anxiety Depression Chronic back pain: narcotics    GI/Hepatic Neg liver ROS, GERD- (s/p esophageal dilation)  Medicated and Controlled,  Endo/Other  Morbid obesity  Renal/GU negative Renal ROS     Musculoskeletal   Abdominal (+) + obese,   Peds  Hematology negative hematology ROS (+)   Anesthesia Other Findings   Reproductive/Obstetrics                          Anesthesia Physical Anesthesia Plan  ASA: II  Anesthesia Plan: General   Post-op Pain Management:    Induction: Intravenous  Airway Management Planned: Oral ETT  Additional Equipment:   Intra-op Plan:   Post-operative Plan: Extubation in OR  Informed Consent: I have reviewed the patients History and Physical, chart, labs and discussed the procedure including the risks, benefits and alternatives for the proposed anesthesia with the patient or authorized representative who has indicated his/her understanding and acceptance.   Dental advisory given  Plan Discussed with: Anesthesiologist, Surgeon and CRNA  Anesthesia Plan Comments: (Plan routine monitors, GETA)       Anesthesia Quick Evaluation

## 2013-04-04 NOTE — Brief Op Note (Signed)
04/04/2013  9:49 AM  PATIENT:  Jessica Gould  67 y.o. 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  PROCEDURE:  Procedure(s): Left L3-4 lateral recess decompression and Left L3-4 foraminotomy, Removal of Rod and Screws Left L4-5 (N/A) HARDWARE REMOVAL (N/A) OR Microscope.  SURGEON:  Surgeon(s) and Role:  Kerrin Champagne, MD - Primary  PHYSICIAN ASSISTANT: Maud Deed, PA-C  ANESTHESIA:   local, general and Dr. Jean Rosenthal.  EBL:  Total I/O In: 1000 [I.V.:1000] Out: 250 [Urine:200; Blood:50]  BLOOD ADMINISTERED:none  DRAINS: Urinary Catheter (Foley)   LOCAL MEDICATIONS USED:  MARCAINE    and Amount: 10 ml  SPECIMEN:  No Specimen  DISPOSITION OF SPECIMEN:  N/A  COUNTS:  YES  TOURNIQUET:  * No tourniquets in log *  DICTATION: .Dragon Dictation  PLAN OF CARE: Admit to inpatient   PATIENT DISPOSITION:  PACU - hemodynamically stable.   Delay start of Pharmacological VTE agent (>24hrs) due to surgical blood loss or risk of bleeding: yes

## 2013-04-04 NOTE — Op Note (Signed)
04/04/2013  9:54 AM  PATIENT:  Jessica Gould  67 y.o. 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  PROCEDURE:  Procedure(s): Left L3-4 lateral recess decompression and Left L3-4 foraminotomy, Removal of Rod and Screws Left L4-5 HARDWARE REMOVAL OR Microscope.    SURGEON:  Kerrin Champagne, MD     ASSISTANT:  Maud Deed, PA-C  (Present throughout the entire procedure and necessary for completion of procedure in a timely manner)     ANESTHESIA:  General,supplemented with local anesthetic marcaine 1/2% with epi 1/200,000 10cc. Dr. Jean Rosenthal.    COMPLICATIONS:  None.   DRAINS: Foley to SD    COMPONENTS:Removed 2 Monarch pedicle screws and rod left side.   EBL: 100cc   PROCEDURE: The patient was met in the holding area, and the appropriate left L3-4 and L4-5 side identified and marked with an "X" and my initials. The patient was then transported to OR.The patient was then placed under  general anesthesia without using video laryngoscope, trachea was anterior. The patient received appropriate preoperative antibiotic prophylaxis. Nursing staff inserted a Foley catheter under sterile conditions.  Patient was placed on the operative table in a prone position.  Wilson frame and sliding OR table were used. The thoracolumbar spine was then prepped using sterile conditions and draped using sterile technique with DuraPrep.  Time-out procedure was called and correct. Incision in the midline ellipsing the old incision scar extending from L 2 to L5. Incision was infiltrated with Marcaine half percent with1-200,000 epinephrine. Incision carried through the lumbodorsal fashion on both sides of the spinous process of L2 and L3 down to the base of the  spinous process of L3 and then to the base of the spinous process of L5. Electrocautery then used to control bleeders. And exposure carried out over the left L3-4 and L4-5 facets exposing the hardware pedicle screws and rods left side of the posterior elements. Soft tissue overlying the pedicle screws fasteners and rod debrided using [place] and [employer]. The fastener caps then loosened using the hex T-handled wrench and then the fasteners caps were removed. Rods then carefully debrided and resected. Each of the pedicle screws were then removed using the Monarch pedicle screw remover. Thrombin-soaked Gelfoam then placed in the pedicle screw opening. Localization crosstable radiograph obtained with female at the base of the spinous process of L3 identifying the clamped just posterior to the superior portion of the pedicle of the of L4.  Debridement of the laminotomy area extending from the inferior aspect of L3 centrally and left side of the left medial posterior elements and over the superior aspect of the L5 lamina. Curettes as well as Kerrisons were used for development of the interval between the previous laminotomy thecal sac medially and the bony laminotomy area along the left side at L3-4 and L4-5. Osteotome then used to resect bone over the the medial aspect of the L3-4 facet resecting approximately 10-15% of the facet and debriding this material using 2 and 3 mm Kerrisons. The operating room microscope sterilely draped brought into the field. Using the operating room microscope then debridement was carried out along the left lateral recess at L3-4 resecting residual ligamentum flavum impressing on the left sided thecal sac and continued to resect both the lamina and hypertrophic ligamentum  flavum on the medial facet of L3-4 up to its superiormost attachment to the ventral aspect of the lamina of L3 and the area of prominent reflected ligamentum flavum off of the L3-4 facet. Both to the ligamentum flavum  and hypertrophic facet bone appeared to be impressing upon the left side L3-4 thecal sac and causing left-sided L4 nerve root entrapment as well as left L3 nerve foraminal narrowing. Following resection of ligamentum flavum a blunt-tipped nerve hook could be easily passed out the L3 and L4 neuroforamen then a hockey-stick nerve probe could also be passed out the L3 and L4 neuroforamen demonstrating decompression of both of these nerve roots. Irrigation was carried out using copious amounts of normal saline solution bone wax was then applied to the bleeding cancellus bone surfaces. No further bleeding was evident further irrigation then performed and the incision was then closed reapproximating the lumbodorsal muscles in the midline with interrupted #1 Vicryl sutures. Lumbodorsal fascia reapproximated with interrupted #1 Vicryl sutures and the subcutaneous layers approximated with interrupted 0 Vicryl sutures and the skin closed with a running subcutaneous suture of 4-0 Vicryl. Dermabond was applied then MedPlex bandage. Patient was then returned to the supine position reactivated extubated and returned to recovery room in satisfactory condition all instrument and sponge counts were correct.      NITKA,JAMES E  04/04/2013, 9:54 AM

## 2013-04-04 NOTE — Progress Notes (Signed)
UR COMPLETED  

## 2013-04-04 NOTE — Preoperative (Signed)
Beta Blockers   Reason not to administer Beta Blockers:Not Applicable 

## 2013-04-05 DIAGNOSIS — M48061 Spinal stenosis, lumbar region without neurogenic claudication: Secondary | ICD-10-CM | POA: Diagnosis present

## 2013-04-05 MED ORDER — METOCLOPRAMIDE HCL 5 MG/ML IJ SOLN
10.0000 mg | Freq: Four times a day (QID) | INTRAMUSCULAR | Status: AC
  Administered 2013-04-05: 10 mg via INTRAVENOUS
  Filled 2013-04-05: qty 2

## 2013-04-05 MED ORDER — OXYCODONE-ACETAMINOPHEN 5-325 MG PO TABS: 1.0000 | ORAL_TABLET | ORAL | Status: DC | PRN

## 2013-04-05 MED ORDER — FENTANYL 25 MCG/HR TD PT72
25.0000 ug | MEDICATED_PATCH | TRANSDERMAL | Status: DC
  Administered 2013-04-05: 25 ug via TRANSDERMAL
  Filled 2013-04-05: qty 1

## 2013-04-05 NOTE — Evaluation (Signed)
Occupational Therapy Evaluation Patient Details Name: Jessica Gould MRN: [id number] DOB: [birth year] Today's Date: 04/05/2013 Time: 8295-6213 OT Time Calculation (min): 26 min  OT Assessment / Plan / Recommendation History of present illness Pt is a 67 y/o female admitted s/p left L3-L4 lateral recess decompression and left L3-L4 foraminotomy, with removal of rod and screws of left L4-5.   Clinical Impression   Pt admitted with above.  Will benefit from continued acute OT services to address below problem list in prep for return home with family. Pt has good family support and plans to stay at her daughter's house initially following discharge from hospital.    OT Assessment  Patient needs continued OT Services    Follow Up Recommendations  No OT follow up;Supervision/Assistance - 24 hour    Barriers to Discharge      Equipment Recommendations  None recommended by OT    Recommendations for Other Services    Frequency  Min 2X/week    Precautions / Restrictions Precautions Precautions: Fall;Back   Pertinent Vitals/Pain See vitals    ADL  Eating/Feeding: Performed;Independent Where Assessed - Eating/Feeding: Chair Grooming: Performed;Brushing hair;Supervision/safety Where Assessed - Grooming: Unsupported standing Upper Body Bathing: Simulated;Set up Where Assessed - Upper Body Bathing: Unsupported sitting Lower Body Bathing: Simulated;Minimal assistance Where Assessed - Lower Body Bathing: Unsupported sit to stand Upper Body Dressing: Performed;Set up Where Assessed - Upper Body Dressing: Unsupported sitting Lower Body Dressing: Simulated;Minimal assistance Where Assessed - Lower Body Dressing: Unsupported sit to stand Toilet Transfer: Simulated;Min guard Toilet Transfer Method: Sit to Barista:  (chair) Tub/Shower Transfer: Performed;Min guard Tub/Shower Transfer Method: Ambulating Equipment Used: Gait belt;Rolling walker;Long-handled  sponge;Reacher Transfers/Ambulation Related to ADLs: supervision with RW ADL Comments: Reviewed back precautions and handout with pt and spouse.  Pt has several reachers at home. Educated pt on use of reacher for LB dressing tasks. Pt's spouse reports he typically helps with socks/shoes as needed.  Pt reports difficulty with performing toileting hygiene since sx (toileting with RN staff). Educated pt on use of toilet aid or attaining long handled tongs to be used as toilet aid.   Min guard during tub transfer for safety only.  Pt demo'd good technique while maintaining back precautions.    OT Diagnosis: Acute pain  OT Problem List: Decreased activity tolerance;Pain;Decreased knowledge of use of DME or AE;Decreased knowledge of precautions OT Treatment Interventions: Self-care/ADL training;DME and/or AE instruction;Therapeutic activities;Patient/family education   OT Goals(Current goals can be found in the care plan section) Acute Rehab OT Goals Patient Stated Goal: To return home OT Goal Formulation: With patient Time For Goal Achievement: 04/12/13 Potential to Achieve Goals: Good  Visit Information  Last OT Received On: 04/05/13 Assistance Needed: +1 History of Present Illness: Pt is a 67 y/o female admitted s/p left L3-L4 lateral recess decompression and left L3-L4 foraminotomy, with removal of rod and screws of left L4-5       Prior Functioning     Home Living Family/patient expects to be discharged to:: Private residence Living Arrangements: Spouse/significant other (staying at daughter's house initially) Available Help at Discharge: Family;Available 24 hours/day Type of Home: House Home Access: Stairs to enter [employer] of Steps: 3 Entrance Stairs-Rails: None Home Layout: One level Home Equipment: Walker - 2 wheels;Toilet riser;Bedside commode;Shower seat;Adaptive equipment Adaptive Equipment: Reacher Prior Function Level of Independence: Independent Dominant  Hand: Right         Vision/Perception     Cognition  Cognition Arousal/Alertness: Awake/alert Behavior During  Therapy: WFL for tasks assessed/performed Overall Cognitive Status: Within Functional Limits for tasks assessed    Extremity/Trunk Assessment Upper Extremity Assessment Upper Extremity Assessment: Overall WFL for tasks assessed     Mobility Bed Mobility Bed Mobility: Not assessed Transfers Transfers: Sit to Stand;Stand to Sit Sit to Stand: 4: Min guard;From chair/3-in-1 Stand to Sit: 5: Supervision;To chair/3-in-1 Details for Transfer Assistance: VCs for hand placement     Exercise     Balance     End of Session OT - End of Session Equipment Utilized During Treatment: Rolling walker;Gait belt Activity Tolerance: Patient tolerated treatment well Patient left: in chair;with call bell/phone within reach;with family/visitor present Nurse Communication: Patient requests pain meds  GO   04/05/2013 Cipriano Mile OTR/L Pager [phone] Office [phone]   Cipriano Mile 04/05/2013, 4:06 PM

## 2013-04-05 NOTE — Progress Notes (Signed)
Physical Therapy Treatment Patient Details Name: Jessica Gould MRN: [id number] DOB: May 06, 1946 Today's Date: 04/05/2013 Time: 7829-5621 PT Time Calculation (min): 15 min  PT Assessment / Plan / Recommendation  History of Present Illness Pt is a 67 y/o female admitted s/p left L3-L4 lateral recess decompression and left L3-L4 foraminotomy, with removal of rod and screws of left L4-5   PT Comments   This session focused around negotiation of steps with RW for entrance into home at d/c. Pt and husband were both instructed in backwards stair technique with walker. Prior to d/c, pt should be reinstructed to log roll technique and how to maintain back precautions while rolling and scooting in bed.   Follow Up Recommendations  Home health PT     Does the patient have the potential to tolerate intense rehabilitation     Barriers to Discharge        Equipment Recommendations  None recommended by PT    Recommendations for Other Services    Frequency Min 5X/week   Progress towards PT Goals Progress towards PT goals: Progressing toward goals  Plan Current plan remains appropriate    Precautions / Restrictions Precautions Precautions: Fall;Back Precaution Booklet Issued: Yes (comment) Precaution Comments: Reviewed back precautions, pt needs to review log roll again Restrictions Weight Bearing Restrictions: No   Pertinent Vitals/Pain 5/10 at rest    Mobility  Bed Mobility Bed Mobility: Supine to Sit;Sitting - Scoot to Edge of Bed Supine to Sit: 4: Min assist;HOB flat Sitting - Scoot to [employer] of Bed: 4: Min guard Details for Bed Mobility Assistance: VC's for sequencing and log roll technique to maintain back precautions. Pt needs to review log roll again. Transfers Transfers: Sit to Stand;Stand to Sit Sit to Stand: 4: Min guard;From bed;With upper extremity assist Stand to Sit: 5: Supervision;To chair/3-in-1;With upper extremity assist Details for Transfer Assistance: VC's for hand  placement and to maintain back precautions. Ambulation/Gait Ambulation/Gait Assistance: 4: Min guard Ambulation Distance (Feet): 45 Feet Assistive device: Rolling walker Ambulation/Gait Assistance Details: VC's to maintain back precautions and for improved posture. No physical assist required. Gait Pattern: Step-through pattern;Decreased stride length Gait velocity: Decreased Stairs: Yes Stairs Assistance: 4: Min guard Stair Management Technique: Backwards;With walker Number of Stairs: 4 (2 steps x2) Wheelchair Mobility Wheelchair Mobility: No    Exercises     PT Diagnosis:    PT Problem List:   PT Treatment Interventions:     PT Goals (current goals can now be found in the care plan section) Acute Rehab PT Goals Patient Stated Goal: To return home PT Goal Formulation: With patient/family Time For Goal Achievement: 04/11/13 Potential to Achieve Goals: Good  Visit Information  Last PT Received On: 04/05/13 Assistance Needed: +1 History of Present Illness: Pt is a 67 y/o female admitted s/p left L3-L4 lateral recess decompression and left L3-L4 foraminotomy, with removal of rod and screws of left L4-5    Subjective Data  Subjective: "I was so tired earlier but I feel better now." Patient Stated Goal: To return home   Cognition  Cognition Arousal/Alertness: Awake/alert Behavior During Therapy: WFL for tasks assessed/performed Overall Cognitive Status: Within Functional Limits for tasks assessed    Balance     End of Session PT - End of Session Equipment Utilized During Treatment: Gait belt Activity Tolerance: Patient tolerated treatment well Patient left: Other (comment) (Pt left with OT in gym to review tub transfers) Nurse Communication: Mobility status   GP     Ruthann Cancer  04/05/2013, 4:14 PM  Ruthann Cancer, PT, DPT [phone]

## 2013-04-05 NOTE — Progress Notes (Signed)
Subjective: 1 Day Post-Op Procedure(s) (LRB): Left L3-4 lateral recess decompression and Left L3-4 foraminotomy, Removal of Rod and Screws Left L4-5 (N/A) HARDWARE REMOVAL (N/A) Patient reports pain as severe.  Not controlled with hydrocodone.  Morphine causes nausea.  Has intolerance to oxycontin with headaches but has used percocet in the past without problems.   Nausea most of the day yesterday and not able to eat.  Has not tried to eat this am.  Nausea improved.  Some flatus, no BM Had bladder pain yesterday which resolved with foley removal.  Voiding well now.  Had 50k strep on preop urine cx.  Received vancomycin pre and post op.  Out of bed to chair yesterday for short time.  Mostly back pain, min leg pain. Has not had any ice to back.  Objective: Vital signs in last 24 hours: Temp:  [98.2 F (36.8 C)-98.4 F (36.9 C)] 98.2 F (36.8 C) (11/11 0553) Pulse Rate:  [56-83] 73 (11/11 0553) Resp:  [7-21] 18 (11/11 0553) BP: (108-124)/(45-75) 118/66 mmHg (11/11 0553) SpO2:  [95 %-100 %] 99 % (11/11 0553)  Intake/Output from previous day: 11/10 0701 - 11/11 0700 In: 1200 [I.V.:1200] Out: 1250 [Urine:1200; Blood:50] Intake/Output this shift:    No results found for this basename: HGB,  in the last 72 hours No results found for this basename: WBC, RBC, HCT, PLT,  in the last 72 hours No results found for this basename: NA, K, CL, CO2, BUN, CREATININE, GLUCOSE, CALCIUM,  in the last 72 hours No results found for this basename: LABPT, INR,  in the last 72 hours  ABD soft Neurovascular intact Sensation intact distally Intact pulses distally Dorsiflexion/Plantar flexion intact Incision: dressing C/D/I  Assessment/Plan: 1 Day Post-Op Procedure(s) (LRB): Left L3-4 lateral recess decompression and Left L3-4 foraminotomy, Removal of Rod and Screws Left L4-5 (N/A) HARDWARE REMOVAL (N/A) Up with therapy Plan for discharge tomorrow Pain control:  Will start duragesic patch and  continue robaxin and vicodin.  Use ice pack to back Nausea:  Does not appear to have ileus.  Will continue clear liquids today.  Dc morphine.  Give 2 more doses of reglan.  Pt on stool softeners.   If no improvement in bowel function give supp. Encouraged out of bed activity today with PT  VERNON,SHEILA M 04/05/2013, 8:30 AM

## 2013-04-05 NOTE — Progress Notes (Signed)
04/05/13 Spoke with patient and her husband about HHC.Patient selected [facility]. Contacted Naythan Douthit with [facility] and set up HHPT. Contacted Darian at [facility] and requested rolling walker and 3N1 to be delivered to room prior to discharge.Will continue to follow for discharge needs. Jacquelynn Cree RN, BSN, CCM

## 2013-04-06 ENCOUNTER — Encounter (HOSPITAL_COMMUNITY): Payer: Self-pay | Admitting: Specialist

## 2013-04-06 ENCOUNTER — Ambulatory Visit: Payer: Medicare Other | Admitting: Internal Medicine

## 2013-04-06 MED ORDER — HYDROCODONE-ACETAMINOPHEN 10-325 MG PO TABS: 0.5000 | ORAL_TABLET | ORAL | Status: DC | PRN

## 2013-04-06 MED ORDER — FENTANYL 25 MCG/HR TD PT72: 25.0000 ug | MEDICATED_PATCH | TRANSDERMAL | Status: DC

## 2013-04-06 MED ORDER — METHOCARBAMOL 500 MG PO TABS: 500.0000 mg | ORAL_TABLET | Freq: Four times a day (QID) | ORAL | Status: DC | PRN

## 2013-04-06 NOTE — Progress Notes (Signed)
Occupational Therapy Treatment Patient Details Name: Jessica Gould MRN: [id number] DOB: 06/19/1945 Today's Date: 04/06/2013 Time: 4098-1191 OT Time Calculation (min): 26 min  OT Assessment / Plan / Recommendation  History of present illness Pt is a 67 y/o female admitted s/p left L3-L4 lateral recess decompression and left L3-L4 foraminotomy, with removal of rod and screws of left L4-5   OT comments  Pt progressing toward goals. Anticipate d/c home later today.  Follow Up Recommendations  No OT follow up;Supervision/Assistance - 24 hour    Barriers to Discharge       Equipment Recommendations  None recommended by OT    Recommendations for Other Services    Frequency Min 2X/week   Progress towards OT Goals Progress towards OT goals: Progressing toward goals  Plan Discharge plan remains appropriate    Precautions / Restrictions Precautions Precautions: Fall;Back Precaution Booklet Issued: Yes (comment) Precaution Comments: Reviewed 3/3 back precautions.   Pertinent Vitals/Pain See vitals    ADL  Eating/Feeding: Performed;Independent Where Assessed - Eating/Feeding: Chair Upper Body Dressing: Performed;Set up Where Assessed - Upper Body Dressing: Unsupported sitting Lower Body Dressing: Performed;Supervision/safety Where Assessed - Lower Body Dressing: Unsupported sitting Toilet Transfer: Simulated;Supervision/safety Toilet Transfer Method: Sit to Barista:  (recliner>ambulated in hallway>return to bed) Equipment Used: Rolling walker;Sock aid;Reacher Transfers/Ambulation Related to ADLs: supervision with RW ADL Comments: Educated pt on use of sock aid and reviewed use of reacher for LB dressing. Pt able to return sock aid demo at supervision level with min verbal cueing for technique.      OT Diagnosis:    OT Problem List:   OT Treatment Interventions:     OT Goals(current goals can now be found in the care plan section) Acute Rehab OT  Goals Patient Stated Goal: To return home OT Goal Formulation: With patient Time For Goal Achievement: 04/12/13 Potential to Achieve Goals: Good ADL Goals Pt Will Transfer to Toilet: with supervision;ambulating Pt Will Perform Toileting - Clothing Manipulation and hygiene: with supervision;with adaptive equipment;sit to/from stand Additional ADL Goal #1: Pt will perform bed mobility at mod I level as precursor for EOB ADLs. Additional ADL Goal #2: Pt will independently demonstrate use of AE for LB ADLs as needed.  Visit Information  Last OT Received On: 04/06/13 Assistance Needed: +1 History of Present Illness: Pt is a 67 y/o female admitted s/p left L3-L4 lateral recess decompression and left L3-L4 foraminotomy, with removal of rod and screws of left L4-5    Subjective Data      Prior Functioning       Cognition  Cognition Arousal/Alertness: Awake/alert Behavior During Therapy: WFL for tasks assessed/performed Overall Cognitive Status: Within Functional Limits for tasks assessed    Mobility  Bed Mobility Bed Mobility: Rolling Right;Rolling Left;Sit to Sidelying Left Rolling Right: 5: Supervision Rolling Left: 5: Supervision Sit to Sidelying Left: HOB flat;5: Supervision Details for Bed Mobility Assistance: VCs for technique, especially to keep knees bent when rolling. Pt did not require physical assist. Inr time due to pain. Transfers Transfers: Sit to Stand;Stand to Sit Sit to Stand: 5: Supervision;From bed;From chair/3-in-1;With upper extremity assist Stand to Sit: 5: Supervision;To bed;With upper extremity assist Details for Transfer Assistance: Incr time due to pain/stiffness. Demo'd correct hand placement.    Exercises      Balance     End of Session OT - End of Session Equipment Utilized During Treatment: Rolling walker Activity Tolerance: Patient tolerated treatment well Patient left: in bed;with call bell/phone within  reach Nurse Communication: Patient  requests pain meds  GO    04/06/2013 Cipriano Mile OTR/L Pager [phone] Office [phone]  Cipriano Mile 04/06/2013, 9:04 AM

## 2013-04-06 NOTE — Progress Notes (Signed)
RN reviewed discharge instructions with patient. All questions answered. Prescriptions given. Patient wheeled down to car with volunteer services.

## 2013-04-06 NOTE — Progress Notes (Signed)
Physical Therapy Treatment Patient Details Name: Jessica Gould MRN: [id number] DOB: 1945/12/03 Today's Date: 04/06/2013 Time: 1478-2956 PT Time Calculation (min): 23 min  PT Assessment / Plan / Recommendation  History of Present Illness Pt is a 67 y/o female admitted s/p left L3-L4 lateral recess decompression and left L3-L4 foraminotomy, with removal of rod and screws of left L4-5   PT Comments   Pt moving well.  Safe to d/c home from mobility standpoint.    Follow Up Recommendations  Home health PT     Does the patient have the potential to tolerate intense rehabilitation     Barriers to Discharge        Equipment Recommendations  None recommended by PT    Recommendations for Other Services    Frequency Min 5X/week   Progress towards PT Goals Progress towards PT goals: Progressing toward goals  Plan Current plan remains appropriate    Precautions / Restrictions Precautions Precautions: Fall;Back Precaution Booklet Issued: Yes (comment) Precaution Comments: Reviewed 3/3 back precautions. Restrictions Weight Bearing Restrictions: No       Mobility  Bed Mobility Bed Mobility: Rolling Right;Rolling Left;Left Sidelying to Sit;Sit to Sidelying Left Rolling Right: 5: Supervision Rolling Left: 5: Supervision Left Sidelying to Sit: 6: Modified independent (Device/Increase time) Sitting - Scoot to Edge of Bed: 6: Modified independent (Device/Increase time) Sit to Sidelying Left: 6: Modified independent (Device/Increase time);HOB flat Details for Bed Mobility Assistance: Cues for back precautions with rolling L<>R.   Transfers Transfers: Sit to Stand;Stand to Sit Sit to Stand: 6: Modified independent (Device/Increase time);With upper extremity assist;With armrests;From bed;From chair/3-in-1 Stand to Sit: 6: Modified independent (Device/Increase time);With upper extremity assist;With armrests;To bed;To chair/3-in-1 Details for Transfer Assistance: Incr time due to  pain/stiffness. Demo'd correct hand placement. Ambulation/Gait Ambulation/Gait Assistance: 5: Supervision Ambulation Distance (Feet): 140 Feet Assistive device: Rolling walker Ambulation/Gait Assistance Details: Cues to maintain back precautions when looking around.   Gait Pattern: Step-through pattern Stairs: No      PT Goals (current goals can now be found in the care plan section) Acute Rehab PT Goals Patient Stated Goal: To return home PT Goal Formulation: With patient/family Time For Goal Achievement: 04/11/13 Potential to Achieve Goals: Good  Visit Information  Last PT Received On: 04/06/13 Assistance Needed: +1 History of Present Illness: Pt is a 67 y/o female admitted s/p left L3-L4 lateral recess decompression and left L3-L4 foraminotomy, with removal of rod and screws of left L4-5    Subjective Data  Patient Stated Goal: To return home   Cognition  Cognition Arousal/Alertness: Awake/alert Behavior During Therapy: WFL for tasks assessed/performed Overall Cognitive Status: Within Functional Limits for tasks assessed    Balance     End of Session PT - End of Session Activity Tolerance: Patient tolerated treatment well Patient left: in chair;with call bell/phone within reach;with family/visitor present Nurse Communication: Mobility status   GP     Lara Mulch 04/06/2013, 11:56 AM  Verdell Face, PTA [phone] 04/06/2013

## 2013-04-06 NOTE — Progress Notes (Signed)
Patient ID: Jessica Gould, female   DOB: 04-04-1946, 67 y.o.   MRN: [id number] Subjective: 2 Days Post-Op Procedure(s) (LRB): Left L3-4 lateral recess decompression and Left L3-4 foraminotomy, Removal of Rod and Screws Left L4-5 (N/A) HARDWARE REMOVAL (N/A) Awake, alert and oriented x 4. I slept better last night. No numbness or weakness. Physical therapy to see today and HHN for PT post discharge.  Patient reports pain as mild.    Objective:   VITALS:  Temp:  [97.8 F (36.6 C)-98.6 F (37 C)] 97.8 F (36.6 C) (11/12 0509) Pulse Rate:  [65-89] 65 (11/12 0509) Resp:  [16-18] 18 (11/12 0509) BP: (86-114)/(46-57) 111/57 mmHg (11/12 0509) SpO2:  [97 %-99 %] 98 % (11/12 0509)  Neurologically intact ABD soft Neurovascular intact Sensation intact distally Dorsiflexion/Plantar flexion intact Incision: dressing C/D/I No cellulitis present   LABS No results found for this basename: HGB, WBC, PLT,  in the last 72 hours No results found for this basename: NA, K, CL, CO2, BUN, CREATININE, GLUCOSE,  in the last 72 hours No results found for this basename: LABPT, INR,  in the last 72 hours   Assessment/Plan: 2 Days Post-Op Procedure(s) (LRB): Left L3-4 lateral recess decompression and Left L3-4 foraminotomy, Removal of Rod and Screws Left L4-5 (N/A) HARDWARE REMOVAL (N/A)  [facility] diet Up with therapy Discharge home with home health  Jessica Checketts E 04/06/2013, 7:55 AM

## 2013-04-07 DIAGNOSIS — IMO0001 Reserved for inherently not codable concepts without codable children: Secondary | ICD-10-CM | POA: Diagnosis not present

## 2013-04-07 DIAGNOSIS — M47817 Spondylosis without myelopathy or radiculopathy, lumbosacral region: Secondary | ICD-10-CM | POA: Diagnosis not present

## 2013-04-07 DIAGNOSIS — M48061 Spinal stenosis, lumbar region without neurogenic claudication: Secondary | ICD-10-CM | POA: Diagnosis not present

## 2013-04-07 DIAGNOSIS — T84498A Other mechanical complication of other internal orthopedic devices, implants and grafts, initial encounter: Secondary | ICD-10-CM | POA: Diagnosis not present

## 2013-04-08 DIAGNOSIS — T84498A Other mechanical complication of other internal orthopedic devices, implants and grafts, initial encounter: Secondary | ICD-10-CM | POA: Diagnosis not present

## 2013-04-08 DIAGNOSIS — IMO0001 Reserved for inherently not codable concepts without codable children: Secondary | ICD-10-CM | POA: Diagnosis not present

## 2013-04-10 NOTE — Discharge Summary (Signed)
Physician Discharge Summary  Patient ID: Jessica Gould MRN: [id number] DOB/AGE: 01-31-1946 67 y.o.  Admit date: 04/04/2013 Discharge date: 04/06/2013  Admission Diagnoses:  Principal Problem:   Spinal stenosis of lumbar region   Discharge Diagnoses:  Same  Past Medical History  Diagnosis Date  . Sleep apnea     do not use CPAP  . GERD (gastroesophageal reflux disease)   . Headache(784.0)   . Arthritis   . PONV (postoperative nausea and vomiting)     BP dropprd once    Surgeries: Procedure(s): Left L3-4 lateral recess decompression and Left L3-4 foraminotomy, Removal of Rod and Screws Left L4-5 HARDWARE REMOVAL on 04/04/2013   Consultants:    Discharged Condition: Improved  Hospital Course: Jessica Gould is an 67 y.o. female who was admitted 04/04/2013 with a chief complaint of No chief complaint on file. , and found to have a diagnosis of Spinal stenosis of lumbar region.  They were brought to the operating room on 04/04/2013 and underwent the above named procedures.    They were given perioperative antibiotics:  Anti-infectives   Start     Dose/Rate Route Frequency Ordered Stop   04/04/13 2000  vancomycin (VANCOCIN) IVPB 1000 mg/200 mL premix     1,000 mg 200 mL/hr over 60 Minutes Intravenous  Once 04/04/13 1216 04/04/13 2131   04/04/13 0600  vancomycin (VANCOCIN) IVPB 1000 mg/200 mL premix     1,000 mg 200 mL/hr over 60 Minutes Intravenous On call to O.R. 04/03/13 1352 04/04/13 0745    .Following surgery she was given narcotics for comfort and ice to the perioperative area. POD#1 started PT/OT with  Improved  Leg and buttock pain. Incision pain was significant and a Duragesic Patch 25 mcg was started with good improvement in pain. She did not tolerate oral narcotic medications. POD#2 PT/OT progressed nicely and she was able to walk with assistance stand by. Dressing changed showed no sign of drainage or infection. She was discharged home to continue with  [facility] for rehabilitation.  They were given sequential compression devices, early ambulation, and chemoprophylaxis for DVT prophylaxis.  They benefited maximally from their hospital stay and there were no complications.    Recent vital signs:  Filed Vitals:   04/06/13 0509  BP: 111/57  Pulse: 65  Temp: 97.8 F (36.6 C)  Resp: 18    Recent laboratory studies:  Results for orders placed during the hospital encounter of 03/31/13  SURGICAL PCR SCREEN      Result Value Range   MRSA, PCR NEGATIVE  NEGATIVE   Staphylococcus aureus NEGATIVE  NEGATIVE  URINE CULTURE      Result Value Range   Specimen Description URINE, CLEAN CATCH     Special Requests NONE     Culture  Setup Time       Value: 03/31/2013 20:30     Performed at [facility] Count       Value: 50,000 COLONIES/ML     Performed at [facility]   Culture       Value: GROUP B STREP(S.AGALACTIAE)ISOLATED     Note: TESTING AGAINST S. AGALACTIAE NOT ROUTINELY PERFORMED DUE TO PREDICTABILITY OF AMP/PEN/VAN SUSCEPTIBILITY.     Performed at [facility]   Report Status 04/02/2013 FINAL    CBC      Result Value Range   WBC 8.8  4.0 - 10.5 K/uL   RBC 5.05  3.87 - 5.11 MIL/uL   Hemoglobin 16.0 (*) 12.0 -  15.0 g/dL   HCT 16.1 (*) 09.6 - 04.5 %   MCV 92.5  78.0 - 100.0 fL   MCH 31.7  26.0 - 34.0 pg   MCHC 34.3  30.0 - 36.0 g/dL   RDW 40.9  81.1 - 91.4 %   Platelets 238  150 - 400 K/uL  COMPREHENSIVE METABOLIC PANEL      Result Value Range   Sodium 140  135 - 145 mEq/L   Potassium 3.7  3.5 - 5.1 mEq/L   Chloride 101  96 - 112 mEq/L   CO2 27  19 - 32 mEq/L   Glucose, Bld 94  70 - 99 mg/dL   BUN 16  6 - 23 mg/dL   Creatinine, Ser 7.82  0.50 - 1.10 mg/dL   Calcium 9.6  8.4 - 95.6 mg/dL   Total Protein 7.1  6.0 - 8.3 g/dL   Albumin 4.1  3.5 - 5.2 g/dL   AST 20  0 - 37 U/L   ALT 22  0 - 35 U/L   Alkaline Phosphatase 102  39 - 117 U/L   Total Bilirubin 0.3  0.3 - 1.2 mg/dL   GFR calc non Af  Amer 83 (*) >90 mL/min   GFR calc Af Amer >90  >90 mL/min  URINALYSIS, ROUTINE W REFLEX MICROSCOPIC      Result Value Range   Color, Urine YELLOW  YELLOW   APPearance CLOUDY (*) CLEAR   Specific Gravity, Urine 1.027  1.005 - 1.030   pH 5.0  5.0 - 8.0   Glucose, UA NEGATIVE  NEGATIVE mg/dL   Hgb urine dipstick NEGATIVE  NEGATIVE   Bilirubin Urine SMALL (*) NEGATIVE   Ketones, ur 15 (*) NEGATIVE mg/dL   Protein, ur NEGATIVE  NEGATIVE mg/dL   Urobilinogen, UA 1.0  0.0 - 1.0 mg/dL   Nitrite NEGATIVE  NEGATIVE   Leukocytes, UA MODERATE (*) NEGATIVE  URINE MICROSCOPIC-ADD ON      Result Value Range   Squamous Epithelial / LPF RARE  RARE   WBC, UA 7-10  <3 WBC/hpf   RBC / HPF 0-2  <3 RBC/hpf   Bacteria, UA FEW (*) RARE   Casts HYALINE CASTS (*) NEGATIVE   Urine-Other MUCOUS PRESENT      Discharge Medications:     Medication List         fentaNYL 25 MCG/HR patch  Commonly known as:  DURAGESIC - dosed mcg/hr  Place 1 patch (25 mcg total) onto the skin every 3 (three) days.     HYDROcodone-acetaminophen 10-325 MG per tablet  Commonly known as:  NORCO  Take 0.5-1 tablets by mouth every 4 (four) hours as needed for severe pain (Use for breakthrough pain.).     methocarbamol 500 MG tablet  Commonly known as:  ROBAXIN  Take 1 tablet (500 mg total) by mouth every 6 (six) hours as needed for muscle spasms.     pantoprazole 40 MG tablet  Commonly known as:  PROTONIX  Take 40 mg by mouth daily.     Vitamin B-12 2500 MCG Subl  Place 2,500 mcg under the tongue daily.        Diagnostic Studies: Dg Chest 2 View  03/31/2013   CLINICAL DATA:  Pre-op for lumbar spine surgery. No chest complaints.  EXAM: CHEST  2 VIEW  COMPARISON:  Chest radiographs 03/10/2006  FINDINGS: The heart size and mediastinal contours are normal. The lungs are clear. There is no pleural effusion or pneumothorax. No acute  osseous findings are identified. There are mild thoracic spine degenerative changes.  Cholecystectomy clips are noted.  IMPRESSION: No active cardiopulmonary process.   Electronically Signed   By: Roxy Horseman M.D.   On: 03/31/2013 16:10   Dg Lumbar Spine 1 View  04/04/2013   CLINICAL DATA:  Lumbar surgery.  EXAM: LUMBAR SPINE - 1 VIEW  COMPARISON:  02/28/2013 MR and 02/13/2011 CT.  FINDINGS: Single intraoperative lateral view of the lumbar spine.  Prior fusion L4-5. Last fully open disc space L5-S1.  Metallic probe is immediately inferior to the L3 spinous process at the upper L4 pedicle level.  IMPRESSION: Prior fusion L4-5. Last fully open disc space L5-S1.  Metallic probe is immediately inferior to the L3 spinous process at the upper L4 pedicle level.  This is a call report.   Electronically Signed   By: Bridgett Larsson M.D.   On: 04/04/2013 08:55    Disposition: 01-Home or Self Care      Discharge Orders   Future Orders Complete By Expires   Call MD / Call 911  As directed    Comments:     If you experience chest pain or shortness of breath, CALL 911 and be transported to the hospital emergency room.  If you develope a fever above 101 F, pus (white drainage) or increased drainage or redness at the wound, or calf pain, call your surgeon's office.   Constipation Prevention  As directed    Comments:     Drink plenty of fluids.  Prune juice may be helpful.  You may use a stool softener, such as Colace (over the counter) 100 mg twice a day.  Use MiraLax (over the counter) for constipation as needed.   Diet - low sodium heart healthy  As directed    Discharge instructions  As directed    Comments:     No lifting greater than 10 lbs. Avoid bending, stooping and twisting. Walk in house for first week them may start to get out slowly increasing distance up to one mile by 3 weeks post op. Keep incision dry for 3 days, may use tegaderm or similar water impervious dressing.   Driving restrictions  As directed    Comments:     No driving for 2 weeks   Increase activity slowly as  tolerated  As directed    Lifting restrictions  As directed    Comments:     No lifting for 6 weeks      Follow-up Information   Follow up with NITKA,JAMES E, MD In 2 weeks.   Specialty:  Orthopedic Surgery   Contact information:   [address] [place] [address] [phone]        Signed: Kerrin Champagne 04/10/2013, 11:34 AM

## 2013-04-12 DIAGNOSIS — T84498A Other mechanical complication of other internal orthopedic devices, implants and grafts, initial encounter: Secondary | ICD-10-CM | POA: Diagnosis not present

## 2013-04-12 DIAGNOSIS — IMO0001 Reserved for inherently not codable concepts without codable children: Secondary | ICD-10-CM | POA: Diagnosis not present

## 2013-04-14 DIAGNOSIS — T84498A Other mechanical complication of other internal orthopedic devices, implants and grafts, initial encounter: Secondary | ICD-10-CM | POA: Diagnosis not present

## 2013-04-14 DIAGNOSIS — IMO0001 Reserved for inherently not codable concepts without codable children: Secondary | ICD-10-CM | POA: Diagnosis not present

## 2013-04-17 DIAGNOSIS — T84498A Other mechanical complication of other internal orthopedic devices, implants and grafts, initial encounter: Secondary | ICD-10-CM | POA: Diagnosis not present

## 2013-04-17 DIAGNOSIS — IMO0001 Reserved for inherently not codable concepts without codable children: Secondary | ICD-10-CM | POA: Diagnosis not present

## 2013-04-19 DIAGNOSIS — IMO0001 Reserved for inherently not codable concepts without codable children: Secondary | ICD-10-CM | POA: Diagnosis not present

## 2013-04-19 DIAGNOSIS — T84498A Other mechanical complication of other internal orthopedic devices, implants and grafts, initial encounter: Secondary | ICD-10-CM | POA: Diagnosis not present

## 2013-05-09 DIAGNOSIS — M48061 Spinal stenosis, lumbar region without neurogenic claudication: Secondary | ICD-10-CM | POA: Diagnosis not present

## 2013-05-09 DIAGNOSIS — M47817 Spondylosis without myelopathy or radiculopathy, lumbosacral region: Secondary | ICD-10-CM | POA: Diagnosis not present

## 2013-06-16 DIAGNOSIS — E782 Mixed hyperlipidemia: Secondary | ICD-10-CM | POA: Diagnosis not present

## 2013-06-16 DIAGNOSIS — I10 Essential (primary) hypertension: Secondary | ICD-10-CM | POA: Diagnosis not present

## 2013-06-21 DIAGNOSIS — E782 Mixed hyperlipidemia: Secondary | ICD-10-CM | POA: Diagnosis not present

## 2013-06-21 DIAGNOSIS — M545 Low back pain, unspecified: Secondary | ICD-10-CM | POA: Diagnosis not present

## 2013-06-21 DIAGNOSIS — I10 Essential (primary) hypertension: Secondary | ICD-10-CM | POA: Diagnosis not present

## 2013-06-21 DIAGNOSIS — R1319 Other dysphagia: Secondary | ICD-10-CM | POA: Diagnosis not present

## 2013-06-28 DIAGNOSIS — M545 Low back pain, unspecified: Secondary | ICD-10-CM | POA: Diagnosis not present

## 2013-06-28 DIAGNOSIS — M6281 Muscle weakness (generalized): Secondary | ICD-10-CM | POA: Diagnosis not present

## 2013-06-30 DIAGNOSIS — M545 Low back pain, unspecified: Secondary | ICD-10-CM | POA: Diagnosis not present

## 2013-06-30 DIAGNOSIS — M6281 Muscle weakness (generalized): Secondary | ICD-10-CM | POA: Diagnosis not present

## 2013-07-04 DIAGNOSIS — M545 Low back pain, unspecified: Secondary | ICD-10-CM | POA: Diagnosis not present

## 2013-07-04 DIAGNOSIS — M6281 Muscle weakness (generalized): Secondary | ICD-10-CM | POA: Diagnosis not present

## 2013-07-06 DIAGNOSIS — M545 Low back pain, unspecified: Secondary | ICD-10-CM | POA: Diagnosis not present

## 2013-07-06 DIAGNOSIS — M6281 Muscle weakness (generalized): Secondary | ICD-10-CM | POA: Diagnosis not present

## 2013-07-08 DIAGNOSIS — M545 Low back pain, unspecified: Secondary | ICD-10-CM | POA: Diagnosis not present

## 2013-07-08 DIAGNOSIS — M6281 Muscle weakness (generalized): Secondary | ICD-10-CM | POA: Diagnosis not present

## 2013-07-11 DIAGNOSIS — M6281 Muscle weakness (generalized): Secondary | ICD-10-CM | POA: Diagnosis not present

## 2013-07-11 DIAGNOSIS — M545 Low back pain, unspecified: Secondary | ICD-10-CM | POA: Diagnosis not present

## 2013-07-25 DIAGNOSIS — M545 Low back pain, unspecified: Secondary | ICD-10-CM | POA: Diagnosis not present

## 2013-07-25 DIAGNOSIS — M6281 Muscle weakness (generalized): Secondary | ICD-10-CM | POA: Diagnosis not present

## 2013-07-28 DIAGNOSIS — M6281 Muscle weakness (generalized): Secondary | ICD-10-CM | POA: Diagnosis not present

## 2013-07-28 DIAGNOSIS — M545 Low back pain, unspecified: Secondary | ICD-10-CM | POA: Diagnosis not present

## 2013-08-02 DIAGNOSIS — M545 Low back pain, unspecified: Secondary | ICD-10-CM | POA: Diagnosis not present

## 2013-08-02 DIAGNOSIS — M6281 Muscle weakness (generalized): Secondary | ICD-10-CM | POA: Diagnosis not present

## 2013-08-03 DIAGNOSIS — M48061 Spinal stenosis, lumbar region without neurogenic claudication: Secondary | ICD-10-CM | POA: Diagnosis not present

## 2013-08-03 DIAGNOSIS — M47817 Spondylosis without myelopathy or radiculopathy, lumbosacral region: Secondary | ICD-10-CM | POA: Diagnosis not present

## 2013-08-04 DIAGNOSIS — M545 Low back pain, unspecified: Secondary | ICD-10-CM | POA: Diagnosis not present

## 2013-08-04 DIAGNOSIS — M6281 Muscle weakness (generalized): Secondary | ICD-10-CM | POA: Diagnosis not present

## 2013-08-08 DIAGNOSIS — M545 Low back pain, unspecified: Secondary | ICD-10-CM | POA: Diagnosis not present

## 2013-08-08 DIAGNOSIS — M47817 Spondylosis without myelopathy or radiculopathy, lumbosacral region: Secondary | ICD-10-CM | POA: Diagnosis not present

## 2013-08-19 DIAGNOSIS — D1801 Hemangioma of skin and subcutaneous tissue: Secondary | ICD-10-CM | POA: Diagnosis not present

## 2013-08-19 DIAGNOSIS — L821 Other seborrheic keratosis: Secondary | ICD-10-CM | POA: Diagnosis not present

## 2013-08-19 DIAGNOSIS — L819 Disorder of pigmentation, unspecified: Secondary | ICD-10-CM | POA: Diagnosis not present

## 2013-08-31 DIAGNOSIS — D1801 Hemangioma of skin and subcutaneous tissue: Secondary | ICD-10-CM | POA: Diagnosis not present

## 2013-08-31 DIAGNOSIS — IMO0002 Reserved for concepts with insufficient information to code with codable children: Secondary | ICD-10-CM | POA: Diagnosis not present

## 2013-08-31 DIAGNOSIS — M47812 Spondylosis without myelopathy or radiculopathy, cervical region: Secondary | ICD-10-CM | POA: Diagnosis not present

## 2013-08-31 DIAGNOSIS — M48061 Spinal stenosis, lumbar region without neurogenic claudication: Secondary | ICD-10-CM | POA: Diagnosis not present

## 2013-09-08 DIAGNOSIS — R82998 Other abnormal findings in urine: Secondary | ICD-10-CM | POA: Diagnosis not present

## 2013-09-08 DIAGNOSIS — D492 Neoplasm of unspecified behavior of bone, soft tissue, and skin: Secondary | ICD-10-CM | POA: Diagnosis not present

## 2013-09-08 DIAGNOSIS — R1011 Right upper quadrant pain: Secondary | ICD-10-CM | POA: Diagnosis not present

## 2013-09-08 DIAGNOSIS — N390 Urinary tract infection, site not specified: Secondary | ICD-10-CM | POA: Diagnosis not present

## 2013-09-08 DIAGNOSIS — R1012 Left upper quadrant pain: Secondary | ICD-10-CM | POA: Diagnosis not present

## 2013-09-08 DIAGNOSIS — R319 Hematuria, unspecified: Secondary | ICD-10-CM | POA: Diagnosis not present

## 2013-10-25 DIAGNOSIS — I10 Essential (primary) hypertension: Secondary | ICD-10-CM | POA: Diagnosis not present

## 2013-10-25 DIAGNOSIS — Z79899 Other long term (current) drug therapy: Secondary | ICD-10-CM | POA: Diagnosis not present

## 2013-10-25 DIAGNOSIS — E782 Mixed hyperlipidemia: Secondary | ICD-10-CM | POA: Diagnosis not present

## 2013-10-27 DIAGNOSIS — E782 Mixed hyperlipidemia: Secondary | ICD-10-CM | POA: Diagnosis not present

## 2013-10-27 DIAGNOSIS — M545 Low back pain, unspecified: Secondary | ICD-10-CM | POA: Diagnosis not present

## 2013-10-27 DIAGNOSIS — I10 Essential (primary) hypertension: Secondary | ICD-10-CM | POA: Diagnosis not present

## 2013-11-08 DIAGNOSIS — I7090 Unspecified atherosclerosis: Secondary | ICD-10-CM | POA: Diagnosis not present

## 2013-11-08 DIAGNOSIS — H25019 Cortical age-related cataract, unspecified eye: Secondary | ICD-10-CM | POA: Diagnosis not present

## 2013-11-08 DIAGNOSIS — H348192 Central retinal vein occlusion, unspecified eye, stable: Secondary | ICD-10-CM | POA: Diagnosis not present

## 2013-11-08 DIAGNOSIS — H35379 Puckering of macula, unspecified eye: Secondary | ICD-10-CM | POA: Diagnosis not present

## 2013-11-08 DIAGNOSIS — H35039 Hypertensive retinopathy, unspecified eye: Secondary | ICD-10-CM | POA: Diagnosis not present

## 2013-11-08 DIAGNOSIS — H2510 Age-related nuclear cataract, unspecified eye: Secondary | ICD-10-CM | POA: Diagnosis not present

## 2013-11-15 ENCOUNTER — Encounter (INDEPENDENT_AMBULATORY_CARE_PROVIDER_SITE_OTHER): Payer: Medicare Other | Admitting: Ophthalmology

## 2013-11-15 DIAGNOSIS — H35379 Puckering of macula, unspecified eye: Secondary | ICD-10-CM

## 2013-11-15 DIAGNOSIS — H2510 Age-related nuclear cataract, unspecified eye: Secondary | ICD-10-CM

## 2013-11-15 DIAGNOSIS — I10 Essential (primary) hypertension: Secondary | ICD-10-CM | POA: Diagnosis not present

## 2013-11-15 DIAGNOSIS — H35039 Hypertensive retinopathy, unspecified eye: Secondary | ICD-10-CM

## 2013-11-15 DIAGNOSIS — H43819 Vitreous degeneration, unspecified eye: Secondary | ICD-10-CM | POA: Diagnosis not present

## 2013-12-12 DIAGNOSIS — F341 Dysthymic disorder: Secondary | ICD-10-CM | POA: Diagnosis not present

## 2013-12-12 DIAGNOSIS — H348192 Central retinal vein occlusion, unspecified eye, stable: Secondary | ICD-10-CM | POA: Diagnosis not present

## 2014-01-09 DIAGNOSIS — M5137 Other intervertebral disc degeneration, lumbosacral region: Secondary | ICD-10-CM | POA: Diagnosis not present

## 2014-01-09 DIAGNOSIS — M47817 Spondylosis without myelopathy or radiculopathy, lumbosacral region: Secondary | ICD-10-CM | POA: Diagnosis not present

## 2014-02-08 DIAGNOSIS — R7301 Impaired fasting glucose: Secondary | ICD-10-CM | POA: Diagnosis not present

## 2014-02-08 DIAGNOSIS — E782 Mixed hyperlipidemia: Secondary | ICD-10-CM | POA: Diagnosis not present

## 2014-02-08 DIAGNOSIS — Z0000 Encounter for general adult medical examination without abnormal findings: Secondary | ICD-10-CM | POA: Diagnosis not present

## 2014-02-08 DIAGNOSIS — I10 Essential (primary) hypertension: Secondary | ICD-10-CM | POA: Diagnosis not present

## 2014-02-10 ENCOUNTER — Other Ambulatory Visit: Payer: Self-pay | Admitting: Family Medicine

## 2014-02-10 DIAGNOSIS — E782 Mixed hyperlipidemia: Secondary | ICD-10-CM | POA: Diagnosis not present

## 2014-02-10 DIAGNOSIS — R5381 Other malaise: Secondary | ICD-10-CM | POA: Diagnosis not present

## 2014-02-10 DIAGNOSIS — H348192 Central retinal vein occlusion, unspecified eye, stable: Secondary | ICD-10-CM | POA: Diagnosis not present

## 2014-02-10 DIAGNOSIS — Z0000 Encounter for general adult medical examination without abnormal findings: Secondary | ICD-10-CM | POA: Diagnosis not present

## 2014-02-10 DIAGNOSIS — R5383 Other fatigue: Secondary | ICD-10-CM | POA: Diagnosis not present

## 2014-02-10 DIAGNOSIS — Z1231 Encounter for screening mammogram for malignant neoplasm of breast: Secondary | ICD-10-CM | POA: Diagnosis not present

## 2014-02-10 DIAGNOSIS — N6320 Unspecified lump in the left breast, unspecified quadrant: Secondary | ICD-10-CM

## 2014-02-10 DIAGNOSIS — E2839 Other primary ovarian failure: Secondary | ICD-10-CM

## 2014-02-10 DIAGNOSIS — I10 Essential (primary) hypertension: Secondary | ICD-10-CM | POA: Diagnosis not present

## 2014-02-10 DIAGNOSIS — Z1211 Encounter for screening for malignant neoplasm of colon: Secondary | ICD-10-CM | POA: Diagnosis not present

## 2014-02-10 DIAGNOSIS — R82998 Other abnormal findings in urine: Secondary | ICD-10-CM | POA: Diagnosis not present

## 2014-02-13 ENCOUNTER — Encounter (INDEPENDENT_AMBULATORY_CARE_PROVIDER_SITE_OTHER): Payer: Medicare Other | Admitting: Ophthalmology

## 2014-02-13 DIAGNOSIS — I10 Essential (primary) hypertension: Secondary | ICD-10-CM | POA: Diagnosis not present

## 2014-02-13 DIAGNOSIS — H43819 Vitreous degeneration, unspecified eye: Secondary | ICD-10-CM | POA: Diagnosis not present

## 2014-02-13 DIAGNOSIS — H35379 Puckering of macula, unspecified eye: Secondary | ICD-10-CM

## 2014-02-13 DIAGNOSIS — H35039 Hypertensive retinopathy, unspecified eye: Secondary | ICD-10-CM | POA: Diagnosis not present

## 2014-02-13 DIAGNOSIS — H2510 Age-related nuclear cataract, unspecified eye: Secondary | ICD-10-CM

## 2014-02-23 ENCOUNTER — Other Ambulatory Visit: Payer: Medicare Other

## 2014-03-03 DIAGNOSIS — Z23 Encounter for immunization: Secondary | ICD-10-CM | POA: Diagnosis not present

## 2014-03-03 DIAGNOSIS — I10 Essential (primary) hypertension: Secondary | ICD-10-CM | POA: Diagnosis not present

## 2014-03-08 ENCOUNTER — Ambulatory Visit
Admission: RE | Admit: 2014-03-08 | Discharge: 2014-03-08 | Disposition: A | Discharge status: Home / Self Care | Payer: Medicare Other | Source: Ambulatory Visit | Attending: Family Medicine | Admitting: Family Medicine

## 2014-03-08 DIAGNOSIS — M85832 Other specified disorders of bone density and structure, left forearm: Secondary | ICD-10-CM | POA: Diagnosis not present

## 2014-03-08 DIAGNOSIS — N6320 Unspecified lump in the left breast, unspecified quadrant: Secondary | ICD-10-CM

## 2014-03-08 DIAGNOSIS — R928 Other abnormal and inconclusive findings on diagnostic imaging of breast: Secondary | ICD-10-CM | POA: Diagnosis not present

## 2014-03-08 DIAGNOSIS — E2839 Other primary ovarian failure: Secondary | ICD-10-CM

## 2014-03-08 DIAGNOSIS — Z853 Personal history of malignant neoplasm of breast: Secondary | ICD-10-CM | POA: Diagnosis not present

## 2014-04-10 DIAGNOSIS — M5136 Other intervertebral disc degeneration, lumbar region: Secondary | ICD-10-CM | POA: Diagnosis not present

## 2014-04-10 DIAGNOSIS — M545 Low back pain: Secondary | ICD-10-CM | POA: Diagnosis not present

## 2014-05-04 DIAGNOSIS — S76112A Strain of left quadriceps muscle, fascia and tendon, initial encounter: Secondary | ICD-10-CM | POA: Diagnosis not present

## 2014-05-25 DIAGNOSIS — J209 Acute bronchitis, unspecified: Secondary | ICD-10-CM | POA: Diagnosis not present

## 2014-06-13 DIAGNOSIS — M25552 Pain in left hip: Secondary | ICD-10-CM | POA: Diagnosis not present

## 2014-07-05 DIAGNOSIS — M25552 Pain in left hip: Secondary | ICD-10-CM | POA: Diagnosis not present

## 2014-08-16 ENCOUNTER — Encounter: Payer: Self-pay | Admitting: Physical Medicine & Rehabilitation

## 2014-09-18 ENCOUNTER — Ambulatory Visit: Payer: Medicare Other | Admitting: Physical Medicine & Rehabilitation

## 2014-10-13 ENCOUNTER — Ambulatory Visit: Payer: Medicare Other | Admitting: Physical Medicine & Rehabilitation

## 2014-10-13 ENCOUNTER — Encounter: Payer: Medicare Other | Attending: Physical Medicine & Rehabilitation

## 2014-10-27 DIAGNOSIS — J209 Acute bronchitis, unspecified: Secondary | ICD-10-CM | POA: Diagnosis not present

## 2014-11-01 DIAGNOSIS — J301 Allergic rhinitis due to pollen: Secondary | ICD-10-CM | POA: Diagnosis not present

## 2014-11-01 DIAGNOSIS — G4730 Sleep apnea, unspecified: Secondary | ICD-10-CM | POA: Diagnosis not present

## 2014-11-16 ENCOUNTER — Ambulatory Visit (INDEPENDENT_AMBULATORY_CARE_PROVIDER_SITE_OTHER): Payer: Medicare Other | Admitting: Ophthalmology

## 2014-11-30 ENCOUNTER — Ambulatory Visit (INDEPENDENT_AMBULATORY_CARE_PROVIDER_SITE_OTHER): Payer: Medicare Other | Admitting: Ophthalmology

## 2014-11-30 DIAGNOSIS — H43813 Vitreous degeneration, bilateral: Secondary | ICD-10-CM

## 2014-11-30 DIAGNOSIS — H2513 Age-related nuclear cataract, bilateral: Secondary | ICD-10-CM | POA: Diagnosis not present

## 2014-11-30 DIAGNOSIS — H35033 Hypertensive retinopathy, bilateral: Secondary | ICD-10-CM

## 2014-11-30 DIAGNOSIS — H35373 Puckering of macula, bilateral: Secondary | ICD-10-CM | POA: Diagnosis not present

## 2014-11-30 DIAGNOSIS — I10 Essential (primary) hypertension: Secondary | ICD-10-CM

## 2014-12-04 ENCOUNTER — Ambulatory Visit: Payer: Medicare Other

## 2014-12-04 ENCOUNTER — Ambulatory Visit: Payer: Medicare Other | Admitting: Physical Medicine & Rehabilitation

## 2015-01-10 ENCOUNTER — Institutional Professional Consult (permissible substitution): Payer: Medicare Other | Admitting: Pulmonary Disease

## 2015-01-25 DIAGNOSIS — R0789 Other chest pain: Secondary | ICD-10-CM | POA: Diagnosis not present

## 2015-01-25 DIAGNOSIS — I10 Essential (primary) hypertension: Secondary | ICD-10-CM | POA: Diagnosis not present

## 2015-02-01 ENCOUNTER — Other Ambulatory Visit (HOSPITAL_COMMUNITY): Payer: Self-pay | Admitting: Family Medicine

## 2015-02-01 DIAGNOSIS — R0789 Other chest pain: Secondary | ICD-10-CM

## 2015-02-08 ENCOUNTER — Telehealth (HOSPITAL_COMMUNITY): Payer: Self-pay | Admitting: *Deleted

## 2015-02-08 ENCOUNTER — Telehealth (HOSPITAL_COMMUNITY): Payer: Self-pay | Admitting: Radiology

## 2015-02-08 DIAGNOSIS — M25552 Pain in left hip: Secondary | ICD-10-CM | POA: Diagnosis not present

## 2015-02-08 NOTE — Telephone Encounter (Signed)
Patient given detailed instructions per Myocardial Perfusion Study Information Sheet for test on 02/12/2015 at 8:30. Patient notified to arrive 15 minutes early and that it is imperative to arrive on time for appointment to keep from having the test rescheduled.  If you need to cancel or reschedule your appointment, please call the office within 24 hours of your appointment. Failure to do so may result in a cancellation of your appointment, and a $50 no show fee. Patient verbalized understanding. EHK

## 2015-02-08 NOTE — Telephone Encounter (Signed)
Left message on voicemail in reference to upcoming appointment scheduled for 02/12/15. Phone number given for a call back so details instructions can be given. Hubbard Robinson, RN

## 2015-02-09 ENCOUNTER — Encounter: Payer: Self-pay | Admitting: Cardiology

## 2015-02-12 ENCOUNTER — Ambulatory Visit (HOSPITAL_COMMUNITY): Payer: Medicare Other | Attending: Cardiology

## 2015-02-12 DIAGNOSIS — R0789 Other chest pain: Secondary | ICD-10-CM | POA: Diagnosis not present

## 2015-02-12 LAB — MYOCARDIAL PERFUSION IMAGING
CHL CUP NUCLEAR SRS: 2
CHL CUP NUCLEAR SSS: 2
LV dias vol: 76 mL
LV sys vol: 26 mL
Peak HR: 85 {beats}/min
RATE: 0.31
Rest HR: 71 {beats}/min
SDS: 0
TID: 0.96

## 2015-02-12 MED ORDER — TECHNETIUM TC 99M SESTAMIBI GENERIC - CARDIOLITE
10.8000 | Freq: Once | INTRAVENOUS | Status: AC | PRN
  Administered 2015-02-12: 11 via INTRAVENOUS

## 2015-02-12 MED ORDER — AMINOPHYLLINE 25 MG/ML IV SOLN
75.0000 mg | Freq: Once | INTRAVENOUS | Status: AC
  Administered 2015-02-12: 75 mg via INTRAVENOUS

## 2015-02-12 MED ORDER — REGADENOSON 0.4 MG/5ML IV SOLN
0.4000 mg | Freq: Once | INTRAVENOUS | Status: AC
  Administered 2015-02-12: 0.4 mg via INTRAVENOUS

## 2015-02-12 MED ORDER — TECHNETIUM TC 99M SESTAMIBI GENERIC - CARDIOLITE
32.3000 | Freq: Once | INTRAVENOUS | Status: AC | PRN
  Administered 2015-02-12: 32.3 via INTRAVENOUS

## 2015-02-23 ENCOUNTER — Encounter: Payer: Self-pay | Admitting: Pulmonary Disease

## 2015-02-23 ENCOUNTER — Ambulatory Visit (INDEPENDENT_AMBULATORY_CARE_PROVIDER_SITE_OTHER): Payer: Medicare Other | Admitting: Pulmonary Disease

## 2015-02-23 VITALS — BP 152/98 | HR 61 | Temp 98.4°F | Ht 61.0 in | Wt 189.4 lb

## 2015-02-23 DIAGNOSIS — G4733 Obstructive sleep apnea (adult) (pediatric): Secondary | ICD-10-CM | POA: Diagnosis not present

## 2015-02-23 NOTE — Progress Notes (Signed)
Chief Complaint  Patient presents with  . SLEEP CONSULT    pt referred by Dr. Wilburn Cornelia.  pt states she stops breathing when she is sleeping.   pt states this has been going on for years. pt states she is always restless during the day.  pt states shes had 2 sleep studies done at [facility] unsure of the dates.  Epworth Score : 12    History of Present Illness: Jessica Gould is a 69 y.o. female for evaluation of sleep problems.  She has hx of sleep apnea after sleep study more than 5 yrs ago.  She has been followed by Dr. Wilburn Cornelia after nasal septal repair and turbinate reduction.  She was seen recently, and advised that she needed to get back on CPAP therapy.  She has not uses CPAP for several years.  She had trouble with mask fit.  She still snores.  She has been told that she stops breathing while asleep.  She will wake up feeling liked she is choked, and this makes her feel very anxious.  She can't sleep on her back.  She will wake up with severe headaches.  She will nap in the afternoon for 30 to 45 minutes.  She goes to sleep at 10 pm.  She falls asleep in 10 minutes.  She wakes up several times to use the bathroom.  She gets out of bed at 9 am.  She feels tired in the morning, but forces herself out of bed.  She gets morning headache.  She does not use anything to help her fall sleep or stay awake.  She denies sleep walking, sleep talking, bruxism, or nightmares.  There is no history of restless legs.  She denies sleep hallucinations, sleep paralysis, or cataplexy.  The Epworth score is 12 out of 24.   DANIYLA PFAHLER  has a past medical history of Sleep apnea; GERD (gastroesophageal reflux disease); Headache(784.0); Arthritis; and PONV (postoperative nausea and vomiting).  POLETTE NOFSINGER  has past surgical history that includes Esophageal dilation (03/31/2013); Tonsillectomy (Right); Back surgery (2004); Tubal ligation; Diagnostic laparoscopy; Cholecystectomy; Appendectomy;  Abdominal hysterectomy; Upper gi endoscopy; Lumbar laminectomy (N/A, 04/04/2013); and Hardware Removal (N/A, 04/04/2013).  Prior to Admission medications   Medication Sig Start Date End Date Taking? Authorizing Provider  Cyanocobalamin (VITAMIN B-12) 2500 MCG SUBL Place 2,500 mcg under the tongue daily.   Yes Historical Provider, MD  HYDROcodone-acetaminophen (NORCO) 10-325 MG per tablet Take 0.5-1 tablets by mouth every 4 (four) hours as needed for severe pain (Use for breakthrough pain.). 04/06/13  Yes Jessy Oto, MD  metoprolol tartrate (LOPRESSOR) 25 MG tablet Take 1 tablet by mouth daily. 01/25/15   Historical Provider, MD    Allergies  Allergen Reactions  . Duloxetine     Cymbalta= unknown  . Erythromycin Nausea And Vomiting    Abdominal pain  . Other     "Cat Gut Sutures"= abscess  . Oxycontin [Oxycodone Hcl]     migraine headache  . Pregabalin     Lyrica= unknown  . Sulfamethoxazole-Trimethoprim Swelling    lips  . Penicillins Itching and Rash    Her family history includes Cancer in her father and mother; Heart disease in her brother, father, and sister; Rheumatologic disease in her father and paternal grandmother.  She  reports that she has never smoked. She has never used smokeless tobacco. She reports that she does not drink alcohol or use illicit drugs.  Review of Systems  Constitutional: Negative  for fever and unexpected weight change.  HENT: Negative for congestion, dental problem, ear pain, nosebleeds, postnasal drip, rhinorrhea, sinus pressure, sneezing, sore throat and trouble swallowing.   Eyes: Positive for itching. Negative for redness.  Respiratory: Negative for cough, chest tightness, shortness of breath and wheezing.   Cardiovascular: Negative for palpitations and leg swelling.  Gastrointestinal: Negative for nausea and vomiting.  Genitourinary: Negative for dysuria.  Musculoskeletal: Negative for joint swelling.  Skin: Negative for rash.   Neurological: Positive for headaches.  Hematological: Does not bruise/bleed easily.  Psychiatric/Behavioral: Negative for dysphoric mood. The patient is not nervous/anxious.    Physical Exam: BP 152/98 mmHg  Pulse 61  Temp(Src) 98.4 F (36.9 C) (Oral)  Ht 5\' 1"  (1.549 m)  Wt 189 lb 6.4 oz (85.911 kg)  BMI 35.81 kg/m2  SpO2 98%  General - No distress ENT - No sinus tenderness, no oral exudate, no LAN, no thyromegaly, TM clear, pupils equal/reactive Cardiac - s1s2 regular, no murmur, pulses symmetric Chest - No wheeze/rales/dullness, good air entry, normal respiratory excursion Back - No focal tenderness Abd - Soft, non-tender, no organomegaly, + bowel sounds Ext - No edema Neuro - Normal strength, cranial nerves intact Skin - No rashes Psych - Normal mood, and behavior  Discussion: She has hx of sleep apnea, but has not been on therapy for years.  She continues to have snoring, sleep disruption, apneas, and daytime sleepiness.  Her sleep has been getting worse.  She has hx of HTN, and gets frequent headaches.  I am concerned she still has sleep apnea.  We discussed how sleep apnea can affect various health problems including risks for hypertension, cardiovascular disease, and diabetes.  We also discussed how sleep disruption can increase risks for accident, such as while driving.  Weight loss as a means of improving sleep apnea was also reviewed.  Additional treatment options discussed were CPAP therapy, oral appliance, and surgical intervention.  Assessment/plan:  Obstructive sleep apnea. Plan: - will arrange for home sleep study pending insurance approval  HTN. Plan: - she is to f/u with her PCP  Obesity. Plan: - discussed importance of weight loss   Chesley Mires, M.D. Pager [phone]

## 2015-02-23 NOTE — Patient Instructions (Signed)
Will arrange for home sleep study Will call to arrange for follow up after sleep study reviewed  

## 2015-02-23 NOTE — Progress Notes (Deleted)
   Subjective:    Patient ID: Jessica Gould, female    DOB: 16-Jun-1945, 69 y.o.   MRN: [id number]  HPI    Review of Systems  Constitutional: Negative for fever and unexpected weight change.  HENT: Negative for congestion, dental problem, ear pain, nosebleeds, postnasal drip, rhinorrhea, sinus pressure, sneezing, sore throat and trouble swallowing.   Eyes: Positive for itching. Negative for redness.  Respiratory: Negative for cough, chest tightness, shortness of breath and wheezing.   Cardiovascular: Negative for palpitations and leg swelling.  Gastrointestinal: Negative for nausea and vomiting.  Genitourinary: Negative for dysuria.  Musculoskeletal: Negative for joint swelling.  Skin: Negative for rash.  Neurological: Positive for headaches.  Hematological: Does not bruise/bleed easily.  Psychiatric/Behavioral: Negative for dysphoric mood. The patient is not nervous/anxious.        Objective:   Physical Exam        Assessment & Plan:

## 2015-03-02 DIAGNOSIS — Z23 Encounter for immunization: Secondary | ICD-10-CM | POA: Diagnosis not present

## 2015-03-02 DIAGNOSIS — I10 Essential (primary) hypertension: Secondary | ICD-10-CM | POA: Diagnosis not present

## 2015-03-02 DIAGNOSIS — G4733 Obstructive sleep apnea (adult) (pediatric): Secondary | ICD-10-CM | POA: Diagnosis not present

## 2015-03-02 DIAGNOSIS — E782 Mixed hyperlipidemia: Secondary | ICD-10-CM | POA: Diagnosis not present

## 2015-03-02 DIAGNOSIS — M545 Low back pain: Secondary | ICD-10-CM | POA: Diagnosis not present

## 2015-03-02 DIAGNOSIS — Z6835 Body mass index (BMI) 35.0-35.9, adult: Secondary | ICD-10-CM | POA: Diagnosis not present

## 2015-03-02 DIAGNOSIS — R7301 Impaired fasting glucose: Secondary | ICD-10-CM | POA: Diagnosis not present

## 2015-03-05 DIAGNOSIS — G4733 Obstructive sleep apnea (adult) (pediatric): Secondary | ICD-10-CM | POA: Diagnosis not present

## 2015-03-08 ENCOUNTER — Telehealth: Payer: Self-pay | Admitting: Pulmonary Disease

## 2015-03-08 ENCOUNTER — Other Ambulatory Visit: Payer: Self-pay | Admitting: *Deleted

## 2015-03-08 DIAGNOSIS — G4733 Obstructive sleep apnea (adult) (pediatric): Secondary | ICD-10-CM

## 2015-03-08 NOTE — Telephone Encounter (Signed)
Pt is aware of results. She would like to proceed with CPAP therapy. Order has been placed. Recall for 2 month ROV has been placed. Nothing further was needed at this time.

## 2015-03-08 NOTE — Telephone Encounter (Signed)
HST 03/05/15 >> AHI 18.3, SaO2 low 77%  Will have my nurse inform pt that sleep study shows moderate sleep apnea.  Options are 1) CPAP now, 2) ROV first.  If She is agreeable to CPAP, then please send order for auto CPAP range 5 to 15 cm H2O with heated humidity and mask of choice.  Have download sent 1 month after starting CPAP and set up ROV 2 months after starting CPAP.  ROV can be with me or Tammy Parrett.

## 2015-03-13 DIAGNOSIS — E782 Mixed hyperlipidemia: Secondary | ICD-10-CM | POA: Diagnosis not present

## 2015-03-26 DIAGNOSIS — H3509 Other intraretinal microvascular abnormalities: Secondary | ICD-10-CM | POA: Diagnosis not present

## 2015-03-26 DIAGNOSIS — H25011 Cortical age-related cataract, right eye: Secondary | ICD-10-CM | POA: Diagnosis not present

## 2015-03-26 DIAGNOSIS — H2511 Age-related nuclear cataract, right eye: Secondary | ICD-10-CM | POA: Diagnosis not present

## 2015-03-26 DIAGNOSIS — H35033 Hypertensive retinopathy, bilateral: Secondary | ICD-10-CM | POA: Diagnosis not present

## 2015-04-03 DIAGNOSIS — H2511 Age-related nuclear cataract, right eye: Secondary | ICD-10-CM | POA: Diagnosis not present

## 2015-05-23 DIAGNOSIS — Z1211 Encounter for screening for malignant neoplasm of colon: Secondary | ICD-10-CM | POA: Diagnosis not present

## 2015-05-23 DIAGNOSIS — Z1239 Encounter for other screening for malignant neoplasm of breast: Secondary | ICD-10-CM | POA: Diagnosis not present

## 2015-05-23 DIAGNOSIS — R10811 Right upper quadrant abdominal tenderness: Secondary | ICD-10-CM | POA: Diagnosis not present

## 2015-05-23 DIAGNOSIS — R8299 Other abnormal findings in urine: Secondary | ICD-10-CM | POA: Diagnosis not present

## 2015-05-23 DIAGNOSIS — F321 Major depressive disorder, single episode, moderate: Secondary | ICD-10-CM | POA: Diagnosis not present

## 2015-05-23 DIAGNOSIS — Z0001 Encounter for general adult medical examination with abnormal findings: Secondary | ICD-10-CM | POA: Diagnosis not present

## 2015-05-23 DIAGNOSIS — R1011 Right upper quadrant pain: Secondary | ICD-10-CM | POA: Diagnosis not present

## 2015-05-23 DIAGNOSIS — Z6835 Body mass index (BMI) 35.0-35.9, adult: Secondary | ICD-10-CM | POA: Diagnosis not present

## 2015-06-06 ENCOUNTER — Ambulatory Visit (INDEPENDENT_AMBULATORY_CARE_PROVIDER_SITE_OTHER): Payer: Medicare Other | Admitting: Ophthalmology

## 2015-06-13 ENCOUNTER — Other Ambulatory Visit: Payer: Self-pay

## 2015-06-13 DIAGNOSIS — Z1231 Encounter for screening mammogram for malignant neoplasm of breast: Secondary | ICD-10-CM

## 2015-06-20 ENCOUNTER — Ambulatory Visit (INDEPENDENT_AMBULATORY_CARE_PROVIDER_SITE_OTHER): Payer: Medicare Other | Admitting: Ophthalmology

## 2015-06-20 DIAGNOSIS — H43813 Vitreous degeneration, bilateral: Secondary | ICD-10-CM | POA: Diagnosis not present

## 2015-06-20 DIAGNOSIS — H26491 Other secondary cataract, right eye: Secondary | ICD-10-CM

## 2015-06-20 DIAGNOSIS — I10 Essential (primary) hypertension: Secondary | ICD-10-CM

## 2015-06-20 DIAGNOSIS — H2512 Age-related nuclear cataract, left eye: Secondary | ICD-10-CM

## 2015-06-20 DIAGNOSIS — H35033 Hypertensive retinopathy, bilateral: Secondary | ICD-10-CM | POA: Diagnosis not present

## 2015-06-20 DIAGNOSIS — H35373 Puckering of macula, bilateral: Secondary | ICD-10-CM

## 2015-06-20 DIAGNOSIS — H59031 Cystoid macular edema following cataract surgery, right eye: Secondary | ICD-10-CM

## 2015-06-29 ENCOUNTER — Ambulatory Visit
Admission: RE | Admit: 2015-06-29 | Discharge: 2015-06-29 | Disposition: A | Discharge status: Home / Self Care | Payer: Medicare Other | Source: Ambulatory Visit

## 2015-06-29 DIAGNOSIS — Z1231 Encounter for screening mammogram for malignant neoplasm of breast: Secondary | ICD-10-CM

## 2015-07-02 DIAGNOSIS — Z23 Encounter for immunization: Secondary | ICD-10-CM | POA: Diagnosis not present

## 2015-07-02 DIAGNOSIS — R42 Dizziness and giddiness: Secondary | ICD-10-CM | POA: Diagnosis not present

## 2015-07-02 DIAGNOSIS — F321 Major depressive disorder, single episode, moderate: Secondary | ICD-10-CM | POA: Diagnosis not present

## 2015-07-09 ENCOUNTER — Other Ambulatory Visit (INDEPENDENT_AMBULATORY_CARE_PROVIDER_SITE_OTHER): Payer: Medicare Other | Admitting: Ophthalmology

## 2015-07-09 DIAGNOSIS — H26491 Other secondary cataract, right eye: Secondary | ICD-10-CM

## 2015-07-30 ENCOUNTER — Ambulatory Visit (INDEPENDENT_AMBULATORY_CARE_PROVIDER_SITE_OTHER): Payer: Medicare Other | Admitting: Ophthalmology

## 2015-08-08 ENCOUNTER — Ambulatory Visit (INDEPENDENT_AMBULATORY_CARE_PROVIDER_SITE_OTHER): Payer: Medicare Other | Admitting: Ophthalmology

## 2015-08-08 DIAGNOSIS — H59031 Cystoid macular edema following cataract surgery, right eye: Secondary | ICD-10-CM | POA: Diagnosis not present

## 2015-08-08 DIAGNOSIS — H2701 Aphakia, right eye: Secondary | ICD-10-CM | POA: Diagnosis not present

## 2015-09-05 ENCOUNTER — Telehealth: Payer: Self-pay | Admitting: *Deleted

## 2015-09-05 NOTE — Telephone Encounter (Signed)
-----   Message from Debbora Dus sent at 09/03/2015  4:37 PM EDT ----- Jessica Gould-  Pt's husband Jeannette How, calling to see who you would recommend her see, for problem with her carotid arteries. Pls call [phone].  Thanks, [employer]

## 2015-09-05 NOTE — Telephone Encounter (Signed)
Left message to call back to discuss concerns. 

## 2015-09-06 NOTE — Telephone Encounter (Signed)
Per husband - pt has been having blurred vision, H/A and dizziness.  Pt saw Dr Angelena Form in 2010 for the same s/s.  Her eye dr recommended her to see a neurologist.  Husband is asking who she should see.  Advised she should start with her PCP for further evaluation and recommendations as to whether she see Neuro or cardiology again.  Nothing specifically abnormal was found in her prior cardiac eval. Husband stated understanding.  They are spending the weekend at the [facility] and are looking forward to it.  She has an appt with Dr Tobie Poet early next week and she will f/u as recommended.

## 2015-09-12 DIAGNOSIS — M47817 Spondylosis without myelopathy or radiculopathy, lumbosacral region: Secondary | ICD-10-CM | POA: Diagnosis not present

## 2015-09-13 DIAGNOSIS — R002 Palpitations: Secondary | ICD-10-CM | POA: Diagnosis not present

## 2015-09-13 DIAGNOSIS — F321 Major depressive disorder, single episode, moderate: Secondary | ICD-10-CM | POA: Diagnosis not present

## 2015-09-13 DIAGNOSIS — N3001 Acute cystitis with hematuria: Secondary | ICD-10-CM | POA: Diagnosis not present

## 2015-09-13 DIAGNOSIS — R10811 Right upper quadrant abdominal tenderness: Secondary | ICD-10-CM | POA: Diagnosis not present

## 2015-09-13 DIAGNOSIS — I10 Essential (primary) hypertension: Secondary | ICD-10-CM | POA: Diagnosis not present

## 2015-09-13 DIAGNOSIS — R51 Headache: Secondary | ICD-10-CM | POA: Diagnosis not present

## 2015-09-13 DIAGNOSIS — H538 Other visual disturbances: Secondary | ICD-10-CM | POA: Diagnosis not present

## 2015-09-13 DIAGNOSIS — F5102 Adjustment insomnia: Secondary | ICD-10-CM | POA: Diagnosis not present

## 2015-09-13 DIAGNOSIS — Z8673 Personal history of transient ischemic attack (TIA), and cerebral infarction without residual deficits: Secondary | ICD-10-CM | POA: Diagnosis not present

## 2015-09-20 ENCOUNTER — Encounter (INDEPENDENT_AMBULATORY_CARE_PROVIDER_SITE_OTHER): Payer: Medicare Other | Admitting: Ophthalmology

## 2015-09-20 DIAGNOSIS — H35372 Puckering of macula, left eye: Secondary | ICD-10-CM | POA: Diagnosis not present

## 2015-09-20 DIAGNOSIS — H35033 Hypertensive retinopathy, bilateral: Secondary | ICD-10-CM | POA: Diagnosis not present

## 2015-09-20 DIAGNOSIS — H43813 Vitreous degeneration, bilateral: Secondary | ICD-10-CM

## 2015-09-20 DIAGNOSIS — H59031 Cystoid macular edema following cataract surgery, right eye: Secondary | ICD-10-CM

## 2015-09-20 DIAGNOSIS — I10 Essential (primary) hypertension: Secondary | ICD-10-CM | POA: Diagnosis not present

## 2015-09-21 ENCOUNTER — Other Ambulatory Visit: Payer: Self-pay | Admitting: Family Medicine

## 2015-09-21 DIAGNOSIS — R10811 Right upper quadrant abdominal tenderness: Secondary | ICD-10-CM

## 2015-09-21 DIAGNOSIS — Z8673 Personal history of transient ischemic attack (TIA), and cerebral infarction without residual deficits: Secondary | ICD-10-CM

## 2015-09-28 ENCOUNTER — Other Ambulatory Visit: Payer: Self-pay | Admitting: Family Medicine

## 2015-09-28 DIAGNOSIS — R10811 Right upper quadrant abdominal tenderness: Secondary | ICD-10-CM

## 2015-09-28 DIAGNOSIS — Z8673 Personal history of transient ischemic attack (TIA), and cerebral infarction without residual deficits: Secondary | ICD-10-CM

## 2015-09-28 DIAGNOSIS — R1011 Right upper quadrant pain: Secondary | ICD-10-CM

## 2015-09-28 DIAGNOSIS — R51 Headache: Secondary | ICD-10-CM

## 2015-09-28 DIAGNOSIS — R519 Headache, unspecified: Secondary | ICD-10-CM

## 2015-10-01 ENCOUNTER — Ambulatory Visit
Admission: RE | Admit: 2015-10-01 | Discharge: 2015-10-01 | Disposition: A | Discharge status: Home / Self Care | Payer: Medicare Other | Source: Ambulatory Visit | Attending: Family Medicine | Admitting: Family Medicine

## 2015-10-01 DIAGNOSIS — K5730 Diverticulosis of large intestine without perforation or abscess without bleeding: Secondary | ICD-10-CM | POA: Diagnosis not present

## 2015-10-01 DIAGNOSIS — R1011 Right upper quadrant pain: Secondary | ICD-10-CM

## 2015-10-01 DIAGNOSIS — R10811 Right upper quadrant abdominal tenderness: Secondary | ICD-10-CM

## 2015-10-02 ENCOUNTER — Other Ambulatory Visit (HOSPITAL_COMMUNITY): Payer: Self-pay | Admitting: Family Medicine

## 2015-10-02 DIAGNOSIS — Z8673 Personal history of transient ischemic attack (TIA), and cerebral infarction without residual deficits: Secondary | ICD-10-CM

## 2015-10-02 DIAGNOSIS — I6523 Occlusion and stenosis of bilateral carotid arteries: Secondary | ICD-10-CM

## 2015-10-03 ENCOUNTER — Ambulatory Visit (HOSPITAL_COMMUNITY)
Admission: RE | Admit: 2015-10-03 | Discharge: 2015-10-03 | Disposition: A | Discharge status: Home / Self Care | Payer: Medicare Other | Source: Ambulatory Visit | Attending: Cardiovascular Disease | Admitting: Cardiovascular Disease

## 2015-10-03 DIAGNOSIS — I6523 Occlusion and stenosis of bilateral carotid arteries: Secondary | ICD-10-CM

## 2015-10-03 DIAGNOSIS — E785 Hyperlipidemia, unspecified: Secondary | ICD-10-CM | POA: Diagnosis not present

## 2015-10-03 DIAGNOSIS — I340 Nonrheumatic mitral (valve) insufficiency: Secondary | ICD-10-CM | POA: Insufficient documentation

## 2015-10-03 DIAGNOSIS — Z8249 Family history of ischemic heart disease and other diseases of the circulatory system: Secondary | ICD-10-CM | POA: Diagnosis not present

## 2015-10-03 DIAGNOSIS — Z8673 Personal history of transient ischemic attack (TIA), and cerebral infarction without residual deficits: Secondary | ICD-10-CM | POA: Insufficient documentation

## 2015-10-03 DIAGNOSIS — I6522 Occlusion and stenosis of left carotid artery: Secondary | ICD-10-CM | POA: Insufficient documentation

## 2015-10-03 DIAGNOSIS — I071 Rheumatic tricuspid insufficiency: Secondary | ICD-10-CM | POA: Insufficient documentation

## 2015-10-03 DIAGNOSIS — K219 Gastro-esophageal reflux disease without esophagitis: Secondary | ICD-10-CM | POA: Insufficient documentation

## 2015-10-03 DIAGNOSIS — G459 Transient cerebral ischemic attack, unspecified: Secondary | ICD-10-CM

## 2015-10-03 DIAGNOSIS — I119 Hypertensive heart disease without heart failure: Secondary | ICD-10-CM | POA: Diagnosis not present

## 2015-10-08 ENCOUNTER — Ambulatory Visit
Admission: RE | Admit: 2015-10-08 | Discharge: 2015-10-08 | Disposition: A | Discharge status: Home / Self Care | Payer: Medicare Other | Source: Ambulatory Visit | Attending: Family Medicine | Admitting: Family Medicine

## 2015-10-08 DIAGNOSIS — Z8673 Personal history of transient ischemic attack (TIA), and cerebral infarction without residual deficits: Secondary | ICD-10-CM

## 2015-10-08 DIAGNOSIS — H538 Other visual disturbances: Secondary | ICD-10-CM | POA: Diagnosis not present

## 2015-10-08 DIAGNOSIS — R519 Headache, unspecified: Secondary | ICD-10-CM

## 2015-10-08 DIAGNOSIS — R51 Headache: Secondary | ICD-10-CM

## 2015-10-08 MED ORDER — GADOBENATE DIMEGLUMINE 529 MG/ML IV SOLN
15.0000 mL | Freq: Once | INTRAVENOUS | Status: AC | PRN
  Administered 2015-10-08: 15 mL via INTRAVENOUS

## 2015-10-12 DIAGNOSIS — G458 Other transient cerebral ischemic attacks and related syndromes: Secondary | ICD-10-CM | POA: Diagnosis not present

## 2015-10-12 DIAGNOSIS — F5102 Adjustment insomnia: Secondary | ICD-10-CM | POA: Diagnosis not present

## 2015-10-12 DIAGNOSIS — F321 Major depressive disorder, single episode, moderate: Secondary | ICD-10-CM | POA: Diagnosis not present

## 2015-10-12 DIAGNOSIS — I10 Essential (primary) hypertension: Secondary | ICD-10-CM | POA: Diagnosis not present

## 2015-10-12 DIAGNOSIS — R10811 Right upper quadrant abdominal tenderness: Secondary | ICD-10-CM | POA: Diagnosis not present

## 2015-10-12 DIAGNOSIS — K222 Esophageal obstruction: Secondary | ICD-10-CM | POA: Diagnosis not present

## 2015-11-21 ENCOUNTER — Encounter (INDEPENDENT_AMBULATORY_CARE_PROVIDER_SITE_OTHER): Payer: Medicare Other | Admitting: Ophthalmology

## 2015-11-21 DIAGNOSIS — H59031 Cystoid macular edema following cataract surgery, right eye: Secondary | ICD-10-CM | POA: Diagnosis not present

## 2015-11-21 DIAGNOSIS — H43813 Vitreous degeneration, bilateral: Secondary | ICD-10-CM

## 2015-11-21 DIAGNOSIS — H35033 Hypertensive retinopathy, bilateral: Secondary | ICD-10-CM

## 2015-11-21 DIAGNOSIS — I10 Essential (primary) hypertension: Secondary | ICD-10-CM | POA: Diagnosis not present

## 2015-11-21 DIAGNOSIS — H35372 Puckering of macula, left eye: Secondary | ICD-10-CM | POA: Diagnosis not present

## 2015-12-04 DIAGNOSIS — R1314 Dysphagia, pharyngoesophageal phase: Secondary | ICD-10-CM | POA: Diagnosis not present

## 2015-12-04 DIAGNOSIS — R1011 Right upper quadrant pain: Secondary | ICD-10-CM | POA: Diagnosis not present

## 2015-12-04 DIAGNOSIS — K210 Gastro-esophageal reflux disease with esophagitis: Secondary | ICD-10-CM | POA: Diagnosis not present

## 2015-12-04 DIAGNOSIS — K580 Irritable bowel syndrome with diarrhea: Secondary | ICD-10-CM | POA: Diagnosis not present

## 2015-12-10 DIAGNOSIS — K449 Diaphragmatic hernia without obstruction or gangrene: Secondary | ICD-10-CM | POA: Diagnosis not present

## 2015-12-10 DIAGNOSIS — R1310 Dysphagia, unspecified: Secondary | ICD-10-CM | POA: Diagnosis not present

## 2015-12-14 DIAGNOSIS — K449 Diaphragmatic hernia without obstruction or gangrene: Secondary | ICD-10-CM | POA: Diagnosis not present

## 2015-12-14 DIAGNOSIS — K222 Esophageal obstruction: Secondary | ICD-10-CM | POA: Diagnosis not present

## 2015-12-14 DIAGNOSIS — K210 Gastro-esophageal reflux disease with esophagitis: Secondary | ICD-10-CM | POA: Diagnosis not present

## 2015-12-14 DIAGNOSIS — R1011 Right upper quadrant pain: Secondary | ICD-10-CM | POA: Diagnosis not present

## 2015-12-14 DIAGNOSIS — K219 Gastro-esophageal reflux disease without esophagitis: Secondary | ICD-10-CM | POA: Diagnosis not present

## 2015-12-14 DIAGNOSIS — R1310 Dysphagia, unspecified: Secondary | ICD-10-CM | POA: Diagnosis not present

## 2015-12-14 DIAGNOSIS — R1314 Dysphagia, pharyngoesophageal phase: Secondary | ICD-10-CM | POA: Diagnosis not present

## 2015-12-14 DIAGNOSIS — K2970 Gastritis, unspecified, without bleeding: Secondary | ICD-10-CM | POA: Diagnosis not present

## 2016-01-07 DIAGNOSIS — G43909 Migraine, unspecified, not intractable, without status migrainosus: Secondary | ICD-10-CM | POA: Diagnosis not present

## 2016-01-07 DIAGNOSIS — K224 Dyskinesia of esophagus: Secondary | ICD-10-CM | POA: Diagnosis not present

## 2016-01-07 DIAGNOSIS — R1013 Epigastric pain: Secondary | ICD-10-CM | POA: Diagnosis not present

## 2016-01-07 DIAGNOSIS — I10 Essential (primary) hypertension: Secondary | ICD-10-CM | POA: Diagnosis not present

## 2016-01-07 DIAGNOSIS — K228 Other specified diseases of esophagus: Secondary | ICD-10-CM | POA: Diagnosis not present

## 2016-01-07 DIAGNOSIS — R197 Diarrhea, unspecified: Secondary | ICD-10-CM | POA: Diagnosis not present

## 2016-01-07 DIAGNOSIS — K219 Gastro-esophageal reflux disease without esophagitis: Secondary | ICD-10-CM | POA: Diagnosis not present

## 2016-01-07 DIAGNOSIS — K210 Gastro-esophageal reflux disease with esophagitis: Secondary | ICD-10-CM | POA: Diagnosis not present

## 2016-01-07 DIAGNOSIS — R0789 Other chest pain: Secondary | ICD-10-CM | POA: Diagnosis not present

## 2016-01-07 DIAGNOSIS — K5730 Diverticulosis of large intestine without perforation or abscess without bleeding: Secondary | ICD-10-CM | POA: Diagnosis not present

## 2016-01-07 DIAGNOSIS — R109 Unspecified abdominal pain: Secondary | ICD-10-CM | POA: Diagnosis not present

## 2016-01-07 DIAGNOSIS — R1314 Dysphagia, pharyngoesophageal phase: Secondary | ICD-10-CM | POA: Diagnosis not present

## 2016-01-07 DIAGNOSIS — R112 Nausea with vomiting, unspecified: Secondary | ICD-10-CM | POA: Diagnosis not present

## 2016-01-07 DIAGNOSIS — R079 Chest pain, unspecified: Secondary | ICD-10-CM | POA: Diagnosis not present

## 2016-01-07 DIAGNOSIS — K209 Esophagitis, unspecified: Secondary | ICD-10-CM | POA: Diagnosis not present

## 2016-01-07 DIAGNOSIS — I2510 Atherosclerotic heart disease of native coronary artery without angina pectoris: Secondary | ICD-10-CM | POA: Diagnosis not present

## 2016-01-07 DIAGNOSIS — I25110 Atherosclerotic heart disease of native coronary artery with unstable angina pectoris: Secondary | ICD-10-CM | POA: Diagnosis not present

## 2016-01-08 DIAGNOSIS — I10 Essential (primary) hypertension: Secondary | ICD-10-CM | POA: Diagnosis not present

## 2016-01-08 DIAGNOSIS — R1013 Epigastric pain: Secondary | ICD-10-CM | POA: Diagnosis not present

## 2016-01-08 DIAGNOSIS — K209 Esophagitis, unspecified: Secondary | ICD-10-CM | POA: Diagnosis not present

## 2016-01-08 DIAGNOSIS — I200 Unstable angina: Secondary | ICD-10-CM | POA: Diagnosis not present

## 2016-01-08 DIAGNOSIS — K224 Dyskinesia of esophagus: Secondary | ICD-10-CM | POA: Diagnosis not present

## 2016-01-08 DIAGNOSIS — R112 Nausea with vomiting, unspecified: Secondary | ICD-10-CM | POA: Diagnosis not present

## 2016-01-08 DIAGNOSIS — R1030 Lower abdominal pain, unspecified: Secondary | ICD-10-CM | POA: Diagnosis not present

## 2016-01-09 DIAGNOSIS — I2510 Atherosclerotic heart disease of native coronary artery without angina pectoris: Secondary | ICD-10-CM | POA: Diagnosis not present

## 2016-01-09 DIAGNOSIS — I10 Essential (primary) hypertension: Secondary | ICD-10-CM | POA: Diagnosis not present

## 2016-01-09 DIAGNOSIS — R109 Unspecified abdominal pain: Secondary | ICD-10-CM | POA: Diagnosis not present

## 2016-01-09 DIAGNOSIS — K5730 Diverticulosis of large intestine without perforation or abscess without bleeding: Secondary | ICD-10-CM | POA: Diagnosis not present

## 2016-01-09 DIAGNOSIS — R1310 Dysphagia, unspecified: Secondary | ICD-10-CM | POA: Diagnosis not present

## 2016-01-09 DIAGNOSIS — K228 Other specified diseases of esophagus: Secondary | ICD-10-CM | POA: Diagnosis not present

## 2016-01-09 DIAGNOSIS — R1013 Epigastric pain: Secondary | ICD-10-CM | POA: Diagnosis not present

## 2016-01-09 DIAGNOSIS — R079 Chest pain, unspecified: Secondary | ICD-10-CM | POA: Diagnosis not present

## 2016-01-09 DIAGNOSIS — K219 Gastro-esophageal reflux disease without esophagitis: Secondary | ICD-10-CM | POA: Diagnosis not present

## 2016-01-10 ENCOUNTER — Telehealth: Payer: Self-pay | Admitting: Cardiology

## 2016-01-10 DIAGNOSIS — R110 Nausea: Secondary | ICD-10-CM | POA: Diagnosis not present

## 2016-01-10 DIAGNOSIS — R1310 Dysphagia, unspecified: Secondary | ICD-10-CM | POA: Diagnosis not present

## 2016-01-10 DIAGNOSIS — I10 Essential (primary) hypertension: Secondary | ICD-10-CM | POA: Diagnosis present

## 2016-01-10 DIAGNOSIS — R1013 Epigastric pain: Secondary | ICD-10-CM | POA: Diagnosis not present

## 2016-01-10 DIAGNOSIS — Z955 Presence of coronary angioplasty implant and graft: Secondary | ICD-10-CM | POA: Diagnosis not present

## 2016-01-10 DIAGNOSIS — R197 Diarrhea, unspecified: Secondary | ICD-10-CM | POA: Diagnosis not present

## 2016-01-10 DIAGNOSIS — K210 Gastro-esophageal reflux disease with esophagitis: Secondary | ICD-10-CM | POA: Diagnosis present

## 2016-01-10 DIAGNOSIS — Z880 Allergy status to penicillin: Secondary | ICD-10-CM | POA: Diagnosis not present

## 2016-01-10 DIAGNOSIS — K224 Dyskinesia of esophagus: Secondary | ICD-10-CM | POA: Diagnosis not present

## 2016-01-10 DIAGNOSIS — Z79899 Other long term (current) drug therapy: Secondary | ICD-10-CM | POA: Diagnosis not present

## 2016-01-10 DIAGNOSIS — Z7982 Long term (current) use of aspirin: Secondary | ICD-10-CM | POA: Diagnosis not present

## 2016-01-10 DIAGNOSIS — R1314 Dysphagia, pharyngoesophageal phase: Secondary | ICD-10-CM | POA: Diagnosis not present

## 2016-01-10 DIAGNOSIS — G43909 Migraine, unspecified, not intractable, without status migrainosus: Secondary | ICD-10-CM | POA: Diagnosis present

## 2016-01-10 DIAGNOSIS — I2510 Atherosclerotic heart disease of native coronary artery without angina pectoris: Secondary | ICD-10-CM | POA: Diagnosis not present

## 2016-01-10 DIAGNOSIS — K209 Esophagitis, unspecified: Secondary | ICD-10-CM | POA: Diagnosis not present

## 2016-01-10 DIAGNOSIS — R1010 Upper abdominal pain, unspecified: Secondary | ICD-10-CM | POA: Diagnosis not present

## 2016-01-10 NOTE — Telephone Encounter (Signed)
Pt have never seen Dr Valene Bors is pt of Dr Warren Lacy. Pt was admitted to the hospital on Monday morning in [place]. Had a stent put in. They told her to make an appt when she got back to see Dr Warren Lacy in 2 weeks from 01-08-16. They have already sent her records to Dr Warren Lacy.

## 2016-01-11 DIAGNOSIS — K209 Esophagitis, unspecified: Secondary | ICD-10-CM | POA: Diagnosis not present

## 2016-01-11 DIAGNOSIS — I2510 Atherosclerotic heart disease of native coronary artery without angina pectoris: Secondary | ICD-10-CM | POA: Diagnosis not present

## 2016-01-11 DIAGNOSIS — R1010 Upper abdominal pain, unspecified: Secondary | ICD-10-CM | POA: Diagnosis not present

## 2016-01-11 DIAGNOSIS — R197 Diarrhea, unspecified: Secondary | ICD-10-CM | POA: Diagnosis not present

## 2016-01-11 DIAGNOSIS — R1314 Dysphagia, pharyngoesophageal phase: Secondary | ICD-10-CM | POA: Diagnosis not present

## 2016-01-17 ENCOUNTER — Telehealth: Payer: Self-pay | Admitting: Cardiology

## 2016-01-17 NOTE — Telephone Encounter (Signed)
Received records from [facility] for appointment on 02/26/16 with Dr Percival Spanish.  Records given to [facility] (medical records) for Dr Hochrein's schedule on 02/26/16. lp

## 2016-01-18 DIAGNOSIS — R1310 Dysphagia, unspecified: Secondary | ICD-10-CM | POA: Diagnosis not present

## 2016-01-18 DIAGNOSIS — R1013 Epigastric pain: Secondary | ICD-10-CM | POA: Diagnosis not present

## 2016-01-18 DIAGNOSIS — K219 Gastro-esophageal reflux disease without esophagitis: Secondary | ICD-10-CM | POA: Diagnosis not present

## 2016-01-23 ENCOUNTER — Other Ambulatory Visit: Payer: Self-pay

## 2016-01-24 DIAGNOSIS — E782 Mixed hyperlipidemia: Secondary | ICD-10-CM | POA: Diagnosis not present

## 2016-01-24 DIAGNOSIS — I2510 Atherosclerotic heart disease of native coronary artery without angina pectoris: Secondary | ICD-10-CM | POA: Diagnosis not present

## 2016-01-24 DIAGNOSIS — K220 Achalasia of cardia: Secondary | ICD-10-CM | POA: Diagnosis not present

## 2016-01-30 DIAGNOSIS — Z719 Counseling, unspecified: Secondary | ICD-10-CM | POA: Diagnosis not present

## 2016-01-30 DIAGNOSIS — R1313 Dysphagia, pharyngeal phase: Secondary | ICD-10-CM | POA: Diagnosis not present

## 2016-01-30 DIAGNOSIS — Z6836 Body mass index (BMI) 36.0-36.9, adult: Secondary | ICD-10-CM | POA: Diagnosis not present

## 2016-01-30 DIAGNOSIS — R933 Abnormal findings on diagnostic imaging of other parts of digestive tract: Secondary | ICD-10-CM | POA: Diagnosis not present

## 2016-01-31 ENCOUNTER — Ambulatory Visit (INDEPENDENT_AMBULATORY_CARE_PROVIDER_SITE_OTHER): Payer: Medicare Other | Admitting: Cardiology

## 2016-01-31 ENCOUNTER — Encounter: Payer: Self-pay | Admitting: Cardiology

## 2016-01-31 VITALS — BP 144/86 | HR 58 | Ht 61.0 in | Wt 190.6 lb

## 2016-01-31 DIAGNOSIS — I6523 Occlusion and stenosis of bilateral carotid arteries: Secondary | ICD-10-CM | POA: Diagnosis not present

## 2016-01-31 DIAGNOSIS — I10 Essential (primary) hypertension: Secondary | ICD-10-CM

## 2016-01-31 DIAGNOSIS — I25110 Atherosclerotic heart disease of native coronary artery with unstable angina pectoris: Secondary | ICD-10-CM

## 2016-01-31 NOTE — Patient Instructions (Signed)
Medication Instructions:  Take CO Q 10   Labwork: None Ordered  Testing/Procedures: None Ordered  Follow-Up: Your physician recommends that you schedule a follow-up appointment in: 2 Months   Any Other Special Instructions Will Be Listed Below (If Applicable).   If you need a refill on your cardiac medications before your next appointment, please call your pharmacy.

## 2016-01-31 NOTE — Progress Notes (Addendum)
Cardiology Office Note   Date:  01/31/2016   ID:  Jessica Gould, DOB 08-28-1945, MRN [id number] PCP:  Rochel Brome, MD  Cardiologist:   Minus Breeding, MD   Chief Complaint  Patient presents with  . Coronary Artery Disease      History of Present Illness: Jessica Gould is a 70 y.o. female who presents for evaluation after PCI. She was seen in our practice in the distant past. As you know from 2010. She's not had coronary disease but has significant cardiovascular risk factors. Had a perfusion study most recently in 2016 with a well preserved ejection fraction and no evidence of ischemia. She was visiting her daughter in [place] recently and had chest discomfort. I have a report of a catheterization. She describes having a stress test during that admission as well was negative but she continues to have pain and so had cardiac catheterization. She was found to have 30% LAD stenosis. The circumflex had 95-99% proximal stenosis. The right coronary artery 30% stenosis. The EF 70%. She had DES stenting. She did have a small groin hematoma complicating this. She had esophageal pain following this and had to go back to the hospital. She has a problem with esophageal dysmotility. She's having follow-up of this. She presents as a new patient to me.  Since all of this happened she says she not been having any of the chest discomfort that she was having the night she went to the hospital. She's had some fleeting discomfort under her left breast. She is going to start cardiac rehabilitation on Tuesday. She's been having muscle aches last couple of days which she relates to her Lipitor. She had this problem before. She's not having any palpitations, presyncope or syncope. She does have some dyspnea which she relates to the [place].      Past Medical History:  Diagnosis Date  . Arthritis   . CAD (coronary artery disease)   . GERD (gastroesophageal reflux disease)   . Headache(784.0)   . PONV  (postoperative nausea and vomiting)    BP dropprd once  . Sleep apnea    do not use CPAP    Past Surgical History:  Procedure Laterality Date  . ABDOMINAL HYSTERECTOMY    . APPENDECTOMY    . BACK SURGERY  2004   lumbar  . CHOLECYSTECTOMY    . DIAGNOSTIC LAPAROSCOPY    . ESOPHAGEAL DILATION  03/31/2013  . HARDWARE REMOVAL N/A 04/04/2013   Procedure: HARDWARE REMOVAL;  Surgeon: Jessy Oto, MD;  Location: [place];  Service: Orthopedics;  Laterality: N/A;  . LUMBAR LAMINECTOMY N/A 04/04/2013   Procedure: Left L3-4 lateral recess decompression and Left L3-4 foraminotomy, Removal of Rod and Screws Left L4-5;  Surgeon: Jessy Oto, MD;  Location: [place];  Service: Orthopedics;  Laterality: N/A;  . TONSILLECTOMY Right    nodals removed  . TUBAL LIGATION    . UPPER GI ENDOSCOPY       Current Outpatient Prescriptions  Medication Sig Dispense Refill  . aspirin EC 81 MG tablet Take 81 mg by mouth daily.    Marland Kitchen atorvastatin (LIPITOR) 40 MG tablet Take 40 mg by mouth daily.    Marland Kitchen FLUoxetine (PROZAC) 20 MG tablet Take 20 mg by mouth daily.    Marland Kitchen HYDROcodone-acetaminophen (NORCO/VICODIN) 5-325 MG tablet Take 1 tablet by mouth as needed.    . metoprolol succinate (TOPROL-XL) 50 MG 24 hr tablet Take 50 mg by mouth 2 (two) times daily.    Marland Kitchen  metoprolol tartrate (LOPRESSOR) 25 MG tablet Take 1 tablet by mouth daily.    . nitroGLYCERIN (NITROSTAT) 0.4 MG SL tablet Place 0.4 mg under the tongue.    . pantoprazole (PROTONIX) 40 MG tablet Take 40 mg by mouth 2 (two) times daily.    . sucralfate (CARAFATE) 1 GM/10ML suspension Take 10 mL (1 g total) by mouth every six (6) hours.    . ticagrelor (BRILINTA) 90 MG TABS tablet Take 90 mg by mouth 2 (two) times daily.     No current facility-administered medications for this visit.     Allergies:   Statins; Duloxetine; Erythromycin; Other; Oxycontin [oxycodone hcl]; Pregabalin; Sulfamethoxazole-trimethoprim; Penicillins; and Phenobarbital    Social  History:  The patient  reports that she has never smoked. She has never used smokeless tobacco. She reports that she does not drink alcohol or use drugs.   Family History:  The patient's family history includes Cancer in her father and mother; Heart disease (age of onset: 5) in her father; Heart disease (age of onset: 12) in her brother; Heart disease (age of onset: 31) in her sister; Rheumatologic disease in her father and paternal grandmother.    ROS:  Please see the history of present illness.   Otherwise, review of systems are positive for none.   All other systems are reviewed and negative.    PHYSICAL EXAM: VS:  BP (!) 144/86   Pulse (!) 58   Ht 5\' 1"  (1.549 m)   Wt 190 lb 9.6 oz (86.5 kg)   BMI 36.01 kg/m  , BMI Body mass index is 36.01 kg/m. GENERAL:  Well appearing HEENT:  Pupils equal round and reactive, fundi not visualized, oral mucosa unremarkable NECK:  No jugular venous distention, waveform within normal limits, carotid upstroke brisk and symmetric, no bruits, no thyromegaly LYMPHATICS:  No cervical, inguinal adenopathy LUNGS:  Clear to auscultation bilaterally BACK:  No CVA tenderness CHEST:  Unremarkable HEART:  PMI not displaced or sustained,S1 and S2 within normal limits, no S3, no S4, no clicks, no rubs, no murmurs ABD:  Flat, positive bowel sounds normal in frequency in pitch, no bruits, no rebound, no guarding, no midline pulsatile mass, no hepatomegaly, no splenomegaly EXT:  2 plus pulses throughout, no edema, no cyanosis no clubbing SKIN:  No rashes no nodules NEURO:  Cranial nerves II through XII grossly intact, motor grossly intact throughout PSYCH:  Cognitively intact, oriented to person place and time    EKG:  EKG is ordered today. The ekg ordered today demonstrates sinus bradycardia, rate 58, axis within normal limits, intervals within normal limits, no acute ST-T wave changes.   Recent Labs: No results found for requested labs within last 8760  hours.    Lipid Panel No results found for: CHOL, TRIG, HDL, CHOLHDL, VLDL, LDLCALC, LDLDIRECT    Wt Readings from Last 3 Encounters:  01/31/16 190 lb 9.6 oz (86.5 kg)  02/23/15 189 lb 6.4 oz (85.9 kg)  02/12/15 186 lb (84.4 kg)      Other studies Reviewed: Additional studies/ records that were reviewed today include: [facility] Records. Review of the above records demonstrates:  Please see elsewhere in the note.     ASSESSMENT AND PLAN:  CAD:  The patient is doing well. She wants to switch to Plavix because she has some dyspnea. However, she reports being told that she absolutely needed to stay on Brilinta.  I am not clear as to why this would be and I need to get in  contact with her providers to see if she had some issues such as nonreactivity to Plavix. For now she will remain on the meds as listed.  DYSLIPIDEMIA:  She's had problems tolerating statins but she is willing to give it a longer try. She's been a stay on the Lipitor and I'll try coQ 10 to see if that helps  HTN:  The blood pressure is at target. No change in medications is indicated. We will continue with therapeutic lifestyle changes (TLC).   Current medicines are reviewed at length with the patient today.  The patient does not have concerns regarding medicines.  The following changes have been made:  no change  Labs/ tests ordered today include: none  Orders Placed This Encounter  Procedures  . EKG 12-Lead     Disposition:   FU with me in 3 months    Signed, Minus Breeding, MD  01/31/2016 5:33 PM    [place]

## 2016-02-04 DIAGNOSIS — H35371 Puckering of macula, right eye: Secondary | ICD-10-CM | POA: Diagnosis not present

## 2016-02-04 DIAGNOSIS — H35372 Puckering of macula, left eye: Secondary | ICD-10-CM | POA: Diagnosis not present

## 2016-02-04 DIAGNOSIS — Z961 Presence of intraocular lens: Secondary | ICD-10-CM | POA: Diagnosis not present

## 2016-02-04 DIAGNOSIS — H35033 Hypertensive retinopathy, bilateral: Secondary | ICD-10-CM | POA: Diagnosis not present

## 2016-02-06 DIAGNOSIS — R079 Chest pain, unspecified: Secondary | ICD-10-CM | POA: Diagnosis not present

## 2016-02-06 DIAGNOSIS — R1314 Dysphagia, pharyngoesophageal phase: Secondary | ICD-10-CM | POA: Diagnosis not present

## 2016-02-06 DIAGNOSIS — K220 Achalasia of cardia: Secondary | ICD-10-CM | POA: Diagnosis not present

## 2016-02-07 DIAGNOSIS — Z955 Presence of coronary angioplasty implant and graft: Secondary | ICD-10-CM | POA: Diagnosis not present

## 2016-02-07 DIAGNOSIS — R079 Chest pain, unspecified: Secondary | ICD-10-CM | POA: Diagnosis not present

## 2016-02-08 DIAGNOSIS — R079 Chest pain, unspecified: Secondary | ICD-10-CM | POA: Diagnosis not present

## 2016-02-08 DIAGNOSIS — Z955 Presence of coronary angioplasty implant and graft: Secondary | ICD-10-CM | POA: Diagnosis not present

## 2016-02-11 DIAGNOSIS — R079 Chest pain, unspecified: Secondary | ICD-10-CM | POA: Diagnosis not present

## 2016-02-11 DIAGNOSIS — Z955 Presence of coronary angioplasty implant and graft: Secondary | ICD-10-CM | POA: Diagnosis not present

## 2016-02-13 DIAGNOSIS — Z955 Presence of coronary angioplasty implant and graft: Secondary | ICD-10-CM | POA: Diagnosis not present

## 2016-02-13 DIAGNOSIS — R079 Chest pain, unspecified: Secondary | ICD-10-CM | POA: Diagnosis not present

## 2016-02-15 DIAGNOSIS — Z955 Presence of coronary angioplasty implant and graft: Secondary | ICD-10-CM | POA: Diagnosis not present

## 2016-02-15 DIAGNOSIS — R079 Chest pain, unspecified: Secondary | ICD-10-CM | POA: Diagnosis not present

## 2016-02-18 DIAGNOSIS — R079 Chest pain, unspecified: Secondary | ICD-10-CM | POA: Diagnosis not present

## 2016-02-18 DIAGNOSIS — Z955 Presence of coronary angioplasty implant and graft: Secondary | ICD-10-CM | POA: Diagnosis not present

## 2016-02-20 DIAGNOSIS — Z955 Presence of coronary angioplasty implant and graft: Secondary | ICD-10-CM | POA: Diagnosis not present

## 2016-02-20 DIAGNOSIS — R079 Chest pain, unspecified: Secondary | ICD-10-CM | POA: Diagnosis not present

## 2016-02-25 ENCOUNTER — Telehealth: Payer: Self-pay | Admitting: Cardiology

## 2016-02-25 DIAGNOSIS — R079 Chest pain, unspecified: Secondary | ICD-10-CM | POA: Diagnosis not present

## 2016-02-25 DIAGNOSIS — Z955 Presence of coronary angioplasty implant and graft: Secondary | ICD-10-CM | POA: Diagnosis not present

## 2016-02-25 NOTE — Telephone Encounter (Signed)
Spoke with Jessica Gould at [facility]  Patient has been having muscle aches. Tried CoQ10 and she had headaches Requesting trial of Crestor Spoke with patient and she is unsure if she has tried Information systems manager but is willing to try whatever Dr Percival Spanish recommends Will forward to Dr Percival Spanish for review

## 2016-02-25 NOTE — Telephone Encounter (Signed)
New message    Pharmacist calling about the pts rx for atorvastatin. She states that the pt is experiencing pain due to the med. Please call.

## 2016-02-26 ENCOUNTER — Other Ambulatory Visit: Payer: Self-pay | Admitting: *Deleted

## 2016-02-26 ENCOUNTER — Telehealth: Payer: Self-pay | Admitting: Cardiology

## 2016-02-26 ENCOUNTER — Ambulatory Visit: Payer: Medicare Other | Admitting: Cardiology

## 2016-02-26 DIAGNOSIS — K220 Achalasia of cardia: Secondary | ICD-10-CM | POA: Diagnosis not present

## 2016-02-26 MED ORDER — ROSUVASTATIN CALCIUM 20 MG PO TABS: 20.0000 mg | ORAL_TABLET | Freq: Every day | ORAL | 11 refills | Status: DC

## 2016-02-26 NOTE — Telephone Encounter (Signed)
Jessica Gould from [facility] is calling in regards to RX change Crestor 25mg   are not covered she tried Atorvaftain and she had side affects

## 2016-02-26 NOTE — Telephone Encounter (Signed)
Spoke to Amy with [facility].She stated they have left a message for her to call them.She can get generic crestor with a savings card for $ 14 for 30 day supply.Spoke to patient advised her to call [facility] about crestor prescription.  Patient wanted to ask Dr.Hochrein if he talked to [facility] in [place] about [place] causing sob.Stated she drinks a coke before taking Brilinta,but she continues to have sob.Advised I will send message to Dr.Hochrein.

## 2016-02-26 NOTE — Telephone Encounter (Signed)
See previous 02/26/16 note.

## 2016-02-26 NOTE — Telephone Encounter (Signed)
Atorvastatin was D/C and change to Crestor 20 mg daily, pt is having muscle cramps with atorvastatin  Crestor 20 mg #30 11 refills send into [facility] [place]

## 2016-02-27 DIAGNOSIS — Z955 Presence of coronary angioplasty implant and graft: Secondary | ICD-10-CM | POA: Diagnosis not present

## 2016-02-27 DIAGNOSIS — R079 Chest pain, unspecified: Secondary | ICD-10-CM | POA: Diagnosis not present

## 2016-02-29 DIAGNOSIS — F321 Major depressive disorder, single episode, moderate: Secondary | ICD-10-CM | POA: Diagnosis not present

## 2016-02-29 DIAGNOSIS — K220 Achalasia of cardia: Secondary | ICD-10-CM | POA: Diagnosis not present

## 2016-02-29 DIAGNOSIS — I2510 Atherosclerotic heart disease of native coronary artery without angina pectoris: Secondary | ICD-10-CM | POA: Diagnosis not present

## 2016-02-29 DIAGNOSIS — I119 Hypertensive heart disease without heart failure: Secondary | ICD-10-CM | POA: Diagnosis not present

## 2016-02-29 DIAGNOSIS — E782 Mixed hyperlipidemia: Secondary | ICD-10-CM | POA: Diagnosis not present

## 2016-02-29 DIAGNOSIS — Z23 Encounter for immunization: Secondary | ICD-10-CM | POA: Diagnosis not present

## 2016-03-05 DIAGNOSIS — R079 Chest pain, unspecified: Secondary | ICD-10-CM | POA: Diagnosis not present

## 2016-03-05 DIAGNOSIS — Z955 Presence of coronary angioplasty implant and graft: Secondary | ICD-10-CM | POA: Diagnosis not present

## 2016-03-06 ENCOUNTER — Telehealth: Payer: Self-pay | Admitting: Cardiology

## 2016-03-06 NOTE — Telephone Encounter (Signed)
New message    Pt c/o medication issue:  1. Name of Medication: ticagrelor (BRILINTA) 90 MG TABS tablet  2. How are you currently taking this medication (dosage and times per day)? 90 mg twice a day  3. Are you having a reaction (difficulty breathing--STAT)? Yes -SOB   4. What is your medication issue? Wants to nurse   Patient not sob now, only when she taken medication.

## 2016-03-06 NOTE — Telephone Encounter (Signed)
Patient states she's calling again - had called last week Spoke to RN then, notes she's had SOB caused by Brilinta Has been trying caffeine but cannot tell a difference in symptoms. Wants to seek possibility of switching medication.   Wants to know if Dr. Percival Spanish has spoken w Dr. Kalman Shan, the cardiologist who placed stent in [place] ~1 month ago & prescribed med, before making a decision to change medication.  Patient coming up on time of refill, which is why she is calling. Has a few more days of medication. I did state the importance of not missing doses of this medication, patient voiced understanding. She is aware I will seek Dr. Rosezella Florida recommendation and return her call as soon as advice communicated. Pt voiced thanks.

## 2016-03-11 NOTE — Telephone Encounter (Signed)
I have reviewed extensively the [facility] hospital records. I do not see a contraindication to Plavix.  There was no P2 Y1 to testing. There is no suggestion that she would be a Plavix nonresponder. She can stop her Brilinta because she is having shortness of breath. She will start Plavix 75 mg daily by mouth dispense #90 with 3 refills.

## 2016-03-11 NOTE — Telephone Encounter (Signed)
Patient calling to see if Dr. Percival Spanish has spoken to her other doctor about stopping Brilinta due to [facility]. She states she has called and left messages but no one has called her back. I let patient know Dr. Percival Spanish was in not in the office last week but is possibly in the office th is week. She asked if another doctor could tell her what to do and discuss with her doctor on what to do. I let her know majority of the time for another doctor to assess the problem they will have to see her first at an office visit to fully go over her signs and symptoms and concerns. She would like a call back as soon as possible because she only has one pill left and does not know whether she should refill the medication or stop taking it. I will route this message to both Dr. Percival Spanish and Hebert Soho.

## 2016-03-12 MED ORDER — CLOPIDOGREL BISULFATE 75 MG PO TABS: 75.0000 mg | ORAL_TABLET | Freq: Every day | ORAL | 3 refills | Status: DC

## 2016-03-12 NOTE — Telephone Encounter (Signed)
Follow up  Pts PCP Kristen Cox's office called and voiced they're calling for pt due to delay in response from our office and pt feels as though we do not care about the pt.  Pts PCP advised pt how busy our offices are but she would reach out for pt.  I reached out to nurse and nurse stated she will call pt today after last clinic pt.  Please f/u with pt by the end of day business today.

## 2016-03-12 NOTE — Telephone Encounter (Signed)
Spoke with pt letting her know she can STOP the brilinta and START Plavix 75 mg daily, pt is aware and voice understanding  Plavix 75 mg #90 3 refills was send into pt pharmacy [facility]

## 2016-03-12 NOTE — Telephone Encounter (Signed)
Spoke with pt letting her know to STOP Brilinta and START Plavix 75 mg

## 2016-04-14 ENCOUNTER — Telehealth: Payer: Self-pay | Admitting: Cardiology

## 2016-04-14 MED ORDER — SERTRALINE HCL 50 MG PO TABS: 50.0000 mg | ORAL_TABLET | Freq: Every day | ORAL | Status: DC

## 2016-04-14 MED ORDER — ASPIRIN EC 81 MG PO TBEC: 81.0000 mg | DELAYED_RELEASE_TABLET | Freq: Every day | ORAL | 3 refills | Status: DC

## 2016-04-14 NOTE — Telephone Encounter (Signed)
Spoke with pt states that she is having a procedure at [place]. UGI-Endoscopy w/Endo ultrasound on 11-*27-17 and received letter to call and get directions for medications to stop before procedure. Medication list verified and corrected. Also, pt is taking ASA 81mg  daily, should she be taking this with er plavix? Please advise

## 2016-04-14 NOTE — Telephone Encounter (Signed)
New message    Pt verbalized that she was told to stop medications for her procedure.   She is calling to confirm directions

## 2016-04-15 DIAGNOSIS — I10 Essential (primary) hypertension: Secondary | ICD-10-CM | POA: Diagnosis not present

## 2016-04-15 DIAGNOSIS — E782 Mixed hyperlipidemia: Secondary | ICD-10-CM | POA: Diagnosis not present

## 2016-04-15 NOTE — Telephone Encounter (Signed)
Returned call to patient.She stated she is scheduled to have endoscopy in [facility] on Monday 04/21/16.Stated she needs to know if ok to hold Plavix and Aspirin before procedure.Message sent to [place] for advice.

## 2016-04-15 NOTE — Telephone Encounter (Signed)
Pt is calling again to see if she should hold her medication her procedure is on Monday and she is worried

## 2016-04-16 NOTE — Telephone Encounter (Signed)
Spoke with Allena Katz at [facility], letting him know that pt will not be about to stop her Plavix and/or Aspirin, because of stent placed this year and she will need to be on these medication for 1 year, he informed me this  procedure will be cancel and reschedule.  I called patient and inform her that her procedure will be cancel and to give [facility] and call, she voice understanding and stated she will give them a call now

## 2016-04-16 NOTE — Telephone Encounter (Signed)
Please call the patient today.  We would not suggest that she hold Plavix or ASA for any elective procedure for 1 year after stenting.  (Six months at the very least.)

## 2016-04-23 ENCOUNTER — Encounter (INDEPENDENT_AMBULATORY_CARE_PROVIDER_SITE_OTHER): Payer: Self-pay | Admitting: Specialist

## 2016-04-23 ENCOUNTER — Ambulatory Visit (INDEPENDENT_AMBULATORY_CARE_PROVIDER_SITE_OTHER): Payer: Medicare Other

## 2016-04-23 ENCOUNTER — Ambulatory Visit (INDEPENDENT_AMBULATORY_CARE_PROVIDER_SITE_OTHER): Payer: Medicare Other | Admitting: Specialist

## 2016-04-23 VITALS — BP 178/93 | HR 59 | Ht 61.0 in | Wt 189.0 lb

## 2016-04-23 DIAGNOSIS — M545 Low back pain, unspecified: Secondary | ICD-10-CM

## 2016-04-23 DIAGNOSIS — M542 Cervicalgia: Secondary | ICD-10-CM

## 2016-04-23 DIAGNOSIS — M47812 Spondylosis without myelopathy or radiculopathy, cervical region: Secondary | ICD-10-CM

## 2016-04-23 DIAGNOSIS — M5136 Other intervertebral disc degeneration, lumbar region: Secondary | ICD-10-CM | POA: Diagnosis not present

## 2016-04-23 DIAGNOSIS — I6523 Occlusion and stenosis of bilateral carotid arteries: Secondary | ICD-10-CM

## 2016-04-23 MED ORDER — METHYLPREDNISOLONE 4 MG PO TBPK: ORAL_TABLET | ORAL | 0 refills | Status: DC

## 2016-04-23 MED ORDER — HYDROCODONE-ACETAMINOPHEN 5-325 MG PO TABS: 1.0000 | ORAL_TABLET | Freq: Four times a day (QID) | ORAL | 0 refills | Status: DC | PRN

## 2016-04-23 NOTE — Patient Instructions (Addendum)
Avoid frequent bending and stooping  No lifting greater than 10 lbs. May use ice or moist heat for pain. Weight loss is of benefit. Handicap license is approved.  Avoid overhead lifting and overhead use of the arms. Do not lift greater than 5 lbs. Adjust head rest in vehicle to prevent hyperextension if rear ended.  

## 2016-04-23 NOTE — Progress Notes (Signed)
Office Visit Note   Patient: Jessica Gould           Date of Birth: 03/16/1946           MRN: [id number] Visit Date: 04/23/2016              Requested by: Rochel Brome, MD [address] [place], [place] [address] PCP: Rochel Brome, MD   Assessment & Plan: Visit Diagnoses:  1. Acute midline low back pain without sciatica   2. Cervicalgia   3. Spondylosis without myelopathy or radiculopathy, cervical region   4. DDD (degenerative disc disease), lumbar     Plan: requent bending and stooping  No lifting greater than 10 lbs. May use ice or moist heat for pain. Weight loss is of benefit. Handicap license is approved.  Avoid overhead lifting and overhead use of the arms. Do not lift greater than 5 lbs. Adjust head rest in vehicle to prevent hyperextension if rear ended.     Follow-Up Instructions: Return in about 4 weeks (around 05/21/2016).   Orders:  Orders Placed This Encounter  Procedures  . XR Lumbar Spine 2-3 Views  . XR Cervical Spine 2 or 3 views   No orders of the defined types were placed in this encounter.     Procedures: No procedures performed   Clinical Data: No additional findings.   Subjective: Chief Complaint  Patient presents with  . Lower Back - Follow-up, Pain    Patient returns for a 6 month recheck. 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 Low back pain no radiating pain. Denies any numbness or tingling.  Recent increased stresses associated with care of mother in law and son. 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 A lot of lifting and bending involved in the care. Had a stent placed in [place] when visiting daughter August 14th, 2017. Also had a diagnosis of acholasia, was to have an esophageal dilitation this week but it was cancelled due to need to stay on plavix for one year. No arm pain or numbness or paresthesias. No bowel or bladder difficulties.  Complains of neck pain, base of neck posteriorly and lumbar pain posterior and to the left lower back. Pain in neck worse with extension and lateral bending and rotation. Lumbar pain worse with lifting and bending and stooping.               Review of Systems  HENT: Negative.   Eyes: Negative.   Respiratory: Negative.   Cardiovascular: Negative.   Gastrointestinal: Negative.   Endocrine: Negative.   Genitourinary: Negative.   Musculoskeletal: Positive for back pain, gait problem, joint swelling and neck pain.  Skin: Negative.   Allergic/Immunologic: Negative.   Neurological: Positive for weakness.  Hematological: Negative.   Psychiatric/Behavioral: Negative.      Objective: Vital Signs: BP (!) 178/93   Pulse (!) 59   Ht 5\' 1"  (1.549 m)   Wt 189 lb (85.7 kg)   BMI 35.71 kg/m   Physical Exam  Constitutional: She is oriented to person, place, and time. She appears well-developed and well-nourished.  HENT:  Head: Normocephalic and atraumatic.  Eyes: EOM are normal. Pupils are equal, round, and reactive to light.  Neck: Normal range of motion. Neck supple.  Pulmonary/Chest: Effort normal and breath sounds normal.  Abdominal: Soft. Bowel sounds are normal.  Neurological: She is alert and oriented to person, place,  and time.  Skin: Skin is warm and dry.  Psychiatric: She has a normal mood and affect. Her behavior is normal. Judgment and thought content normal.    Back Exam   Tenderness  The patient is experiencing tenderness in the cervical and lumbar.  Range of Motion  Extension: abnormal  Flexion: normal  Lateral Bend Right: abnormal  Lateral Bend Left: abnormal  Rotation Right: abnormal  Rotation Left: abnormal   Muscle Strength  Right Quadriceps:  5/5  Left Quadriceps:  5/5  Right Hamstrings:  5/5  Left Hamstrings:  5/5   Tests  Straight leg raise right: negative Straight leg raise left: negative  Reflexes  Patellar: normal Achilles: normal Biceps:  normal Babinski's sign: normal   Other  Toe Walk: normal Heel Walk: normal Sensation: normal Gait: normal  Erythema: no back redness Scars: absent  Comments:  Decresed extension, pain posteror neck C6-T2, No motor or sensory deficit      Specialty Comments:  No specialty comments available.  Imaging: Xr Cervical Spine 2 Or 3 Views  Result Date: 04/23/2016 AP and Lat of the cervical spine shows a mild curvature of the c spine on AP view with apex to the right , less than 10 degrees, spondylosis of the cervical spine facets left greater than right from C4 to C7. Lateral radiograph with normal lordosis, DDD worst at the C6-7 level. Anterolisthesis C7 on T1 grade 1 of 2-3 mm. No acute fracture of dislocation.   Xr Lumbar Spine 2-3 Views  Result Date: 04/23/2016 DDD L3-4 mild narrowing, no listhesis, solid interbody fusion L4-5 , lordosis is well maintained. Mild facet sclerosis left L5-S1.    PMFS History: Patient Active Problem List   Diagnosis Date Noted  . Spinal stenosis of lumbar region 04/05/2013    Class: Diagnosis of  . ESSENTIAL HYPERTENSION, BENIGN 01/09/2009  . DIZZINESS 01/09/2009  . DYSLIPIDEMIA 01/05/2009  . ANXIETY 01/05/2009  . DEPRESSION 01/05/2009  . MIGRAINE HEADACHE 01/05/2009  . 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 SPINAL STENOSIS, LUMBAR 01/05/2009  . COLONIC POLYPS, HX OF 01/05/2009  . GASTROESOPHAGEAL REFLUX DISEASE, HX OF 01/05/2009   Past Medical History:  Diagnosis Date  . Arthritis   . CAD (coronary artery disease)   . GERD (gastroesophageal reflux disease)   . Headache(784.0)   . PONV (postoperative nausea and vomiting)    BP dropprd once  . Sleep apnea    do not use CPAP    Family History  Problem Relation Age of Onset  . Heart disease Father 12  . Cancer Father   . Rheumatologic disease Father   . Cancer Mother   . Heart disease Brother 73  . Heart disease Sister 52  . Rheumatologic disease Paternal Grandmother     Past Surgical  History:  Procedure Laterality Date  . ABDOMINAL HYSTERECTOMY    . APPENDECTOMY    . BACK SURGERY  2004   lumbar  . CHOLECYSTECTOMY    . DIAGNOSTIC LAPAROSCOPY    . ESOPHAGEAL DILATION  03/31/2013  . HARDWARE REMOVAL N/A 04/04/2013   Procedure: HARDWARE REMOVAL;  Surgeon: Jessy Oto, MD;  Location: [place];  Service: Orthopedics;  Laterality: N/A;  . LUMBAR LAMINECTOMY N/A 04/04/2013   Procedure: Left L3-4 lateral recess decompression and Left L3-4 foraminotomy, Removal of Rod and Screws Left L4-5;  Surgeon: Jessy Oto, MD;  Location: [place];  Service: Orthopedics;  Laterality: N/A;  . TONSILLECTOMY Right    nodals removed  . TUBAL LIGATION    .  UPPER GI ENDOSCOPY     Social History   Occupational History  . Not on file.   Social History Main Topics  . Smoking status: Never Smoker  . Smokeless tobacco: Never Used  . Alcohol use No  . Drug use: No  . Sexual activity: Not on file

## 2016-04-24 DIAGNOSIS — F4321 Adjustment disorder with depressed mood: Secondary | ICD-10-CM | POA: Diagnosis not present

## 2016-04-24 DIAGNOSIS — J00 Acute nasopharyngitis [common cold]: Secondary | ICD-10-CM | POA: Diagnosis not present

## 2016-04-24 DIAGNOSIS — I2510 Atherosclerotic heart disease of native coronary artery without angina pectoris: Secondary | ICD-10-CM | POA: Diagnosis not present

## 2016-04-24 DIAGNOSIS — I119 Hypertensive heart disease without heart failure: Secondary | ICD-10-CM | POA: Diagnosis not present

## 2016-04-24 DIAGNOSIS — F5102 Adjustment insomnia: Secondary | ICD-10-CM | POA: Diagnosis not present

## 2016-04-24 DIAGNOSIS — E782 Mixed hyperlipidemia: Secondary | ICD-10-CM | POA: Diagnosis not present

## 2016-04-24 DIAGNOSIS — K220 Achalasia of cardia: Secondary | ICD-10-CM | POA: Diagnosis not present

## 2016-05-28 ENCOUNTER — Other Ambulatory Visit (INDEPENDENT_AMBULATORY_CARE_PROVIDER_SITE_OTHER): Payer: Self-pay | Admitting: Specialist

## 2016-05-28 DIAGNOSIS — M545 Low back pain, unspecified: Secondary | ICD-10-CM

## 2016-05-28 DIAGNOSIS — M542 Cervicalgia: Secondary | ICD-10-CM

## 2016-05-30 ENCOUNTER — Other Ambulatory Visit (INDEPENDENT_AMBULATORY_CARE_PROVIDER_SITE_OTHER): Payer: Self-pay | Admitting: Specialist

## 2016-05-30 DIAGNOSIS — M542 Cervicalgia: Secondary | ICD-10-CM

## 2016-05-30 DIAGNOSIS — M545 Low back pain, unspecified: Secondary | ICD-10-CM

## 2016-05-30 NOTE — Telephone Encounter (Signed)
Rx Hydrocodone/ [facility]/[place]

## 2016-06-02 MED ORDER — HYDROCODONE-ACETAMINOPHEN 5-325 MG PO TABS: 1.0000 | ORAL_TABLET | Freq: Four times a day (QID) | ORAL | 0 refills | Status: DC | PRN

## 2016-06-03 NOTE — Telephone Encounter (Signed)
Patient is aware that rx is ready for pick up 

## 2016-06-04 ENCOUNTER — Encounter (INDEPENDENT_AMBULATORY_CARE_PROVIDER_SITE_OTHER): Payer: Self-pay | Admitting: Specialist

## 2016-06-04 ENCOUNTER — Ambulatory Visit (INDEPENDENT_AMBULATORY_CARE_PROVIDER_SITE_OTHER): Payer: Medicare Other | Admitting: Specialist

## 2016-06-04 VITALS — BP 128/74 | HR 64 | Ht 61.0 in | Wt 186.0 lb

## 2016-06-04 DIAGNOSIS — M545 Low back pain, unspecified: Secondary | ICD-10-CM

## 2016-06-04 DIAGNOSIS — M542 Cervicalgia: Secondary | ICD-10-CM

## 2016-06-04 DIAGNOSIS — M47812 Spondylosis without myelopathy or radiculopathy, cervical region: Secondary | ICD-10-CM | POA: Diagnosis not present

## 2016-06-04 DIAGNOSIS — M47816 Spondylosis without myelopathy or radiculopathy, lumbar region: Secondary | ICD-10-CM | POA: Diagnosis not present

## 2016-06-04 DIAGNOSIS — M461 Sacroiliitis, not elsewhere classified: Secondary | ICD-10-CM | POA: Diagnosis not present

## 2016-06-04 MED ORDER — HYDROCODONE-ACETAMINOPHEN 5-325 MG PO TABS: 1.0000 | ORAL_TABLET | Freq: Four times a day (QID) | ORAL | 0 refills | Status: DC | PRN

## 2016-06-04 NOTE — Progress Notes (Signed)
Office Visit Note   Patient: Jessica Gould           Date of Birth: Nov 29, 1945           MRN: [id number] Visit Date: 06/04/2016              Requested by: Jessica Brome, MD [address] [place],  [address] PCP: Jessica Brome, MD   Assessment & Plan: Visit Diagnoses:  1. Spondylosis without myelopathy or radiculopathy, cervical region   2. Spondylosis without myelopathy or radiculopathy, lumbar region   3. Sacroiliitis, not elsewhere classified ([place])   4. Acute midline low back pain without sciatica   5. Cervicalgia     Plan: Avoid overhead lifting and overhead use of the arms. Do not lift greater than 5 lbs. Adjust head rest in vehicle to prevent hyperextension if rear ended. Take extra precautions to avoid falling, including use of a cane if you feel weak. Avoid bending, stooping and avoid lifting weights greater than 10 lbs. Avoid prolong standing and walking. Avoid frequent bending and stooping  No lifting greater than 10 lbs. May use ice or moist heat for pain. Weight loss is of benefit. Handicap license is approved. Obtain assist devices, sock applicator and long show horn to help with shoes and socks.   Follow-Up Instructions: Return in about 3 months (around 09/02/2016).   Orders:  No orders of the defined types were placed in this encounter.  Meds ordered this encounter  Medications  . HYDROcodone-acetaminophen (NORCO/VICODIN) 5-325 MG tablet    Sig: Take 1 tablet by mouth every 6 (six) hours as needed for moderate pain.    Dispense:  60 tablet    Refill:  0      Procedures: No procedures performed   Clinical Data: No additional findings.   Subjective: Chief Complaint  Patient presents with  . Neck - Follow-up  . Lower Back - Follow-up, Pain    Jessica Gould is here to follow up on low back and neck pain.  She states that her low back is doing ok right now, but usually bothers her more at night. Says if she moves a certain way then it  feels like a nerve is being pinched. Has night pain that awakens her, it is in the back right buttock greater than left. Increases with sitting, bending and stooping. Able to walk except with exacerbation of the back pain. She does try to walk for exercise. Unable to take arthritis meds. More difficulty donning her shoes and socks. No leg numbness or weakness.  Bowel and bladder function is normal.  She states that her neck is doing better. No arm pain or numbness or weakness.    Review of Systems  Constitutional: Negative.   HENT: Negative.   Eyes: Negative.   Respiratory: Negative.   Cardiovascular: Negative.   Gastrointestinal: Negative.   Endocrine: Negative.   Genitourinary: Negative.   Musculoskeletal: Negative.   Skin: Negative.   Allergic/Immunologic: Negative.   Neurological: Negative.   Hematological: Negative.   Psychiatric/Behavioral: Negative.      Objective: Vital Signs: BP 128/74 (BP Location: Right Arm, Patient Position: Sitting)   Pulse 64   Ht 5\' 1"  (1.549 m)   Wt 186 lb (84.4 kg)   BMI 35.14 kg/m   Physical Exam  Constitutional: She is oriented to person, place, and time. She appears well-developed and well-nourished.  HENT:  Head: Normocephalic and atraumatic.  Eyes: EOM are normal. Pupils are equal, round, and reactive to  light.  Neck: Normal range of motion. Neck supple.  Pulmonary/Chest: Effort normal and breath sounds normal.  Abdominal: Soft. Bowel sounds are normal.  Neurological: She is alert and oriented to person, place, and time.  Skin: Skin is warm and dry.  Psychiatric: She has a normal mood and affect. Her behavior is normal. Judgment and thought content normal.    Back Exam   Tenderness  The patient is experiencing tenderness in the cervical and lumbar.  Range of Motion  Extension: abnormal  Flexion: abnormal  Lateral Bend Right: abnormal  Lateral Bend Left: abnormal  Rotation Right: abnormal  Rotation Left: abnormal   Muscle  Strength  Right Quadriceps:  5/5  Left Quadriceps:  5/5  Right Hamstrings:  5/5  Left Hamstrings:  5/5   Tests  Straight leg raise right: negative Straight leg raise left: negative  Reflexes  Babinski's sign: normal   Other  Toe Walk: normal Heel Walk: normal Sensation: normal Gait: normal  Erythema: no back redness Scars: present      Specialty Comments:  No specialty comments available.  Imaging: No results found.   PMFS History: Patient Active Problem List   Diagnosis Date Noted  . Spinal stenosis of lumbar region 04/05/2013    Class: Diagnosis of  . ESSENTIAL HYPERTENSION, BENIGN 01/09/2009  . DIZZINESS 01/09/2009  . DYSLIPIDEMIA 01/05/2009  . ANXIETY 01/05/2009  . DEPRESSION 01/05/2009  . MIGRAINE HEADACHE 01/05/2009  . 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 SPINAL STENOSIS, LUMBAR 01/05/2009  . COLONIC POLYPS, HX OF 01/05/2009  . GASTROESOPHAGEAL REFLUX DISEASE, HX OF 01/05/2009   Past Medical History:  Diagnosis Date  . Arthritis   . CAD (coronary artery disease)   . GERD (gastroesophageal reflux disease)   . Headache(784.0)   . PONV (postoperative nausea and vomiting)    BP dropprd once  . Sleep apnea    do not use CPAP    Family History  Problem Relation Age of Onset  . Heart disease Father 82  . Cancer Father   . Rheumatologic disease Father   . Cancer Mother   . Heart disease Brother 43  . Heart disease Sister 67  . Rheumatologic disease Paternal Grandmother     Past Surgical History:  Procedure Laterality Date  . ABDOMINAL HYSTERECTOMY    . APPENDECTOMY    . BACK SURGERY  2004   lumbar  . CHOLECYSTECTOMY    . DIAGNOSTIC LAPAROSCOPY    . ESOPHAGEAL DILATION  03/31/2013  . HARDWARE REMOVAL N/A 04/04/2013   Procedure: HARDWARE REMOVAL;  Surgeon: Jessy Oto, MD;  Location: [place];  Service: Orthopedics;  Laterality: N/A;  . LUMBAR LAMINECTOMY N/A 04/04/2013   Procedure: Left L3-4 lateral recess decompression and Left L3-4 foraminotomy, Removal  of Rod and Screws Left L4-5;  Surgeon: Jessy Oto, MD;  Location: [facility];  Service: Orthopedics;  Laterality: N/A;  . TONSILLECTOMY Right    nodals removed  . TUBAL LIGATION    . UPPER GI ENDOSCOPY     Social History   Occupational History  . Not on file.   Social History Main Topics  . Smoking status: Never Smoker  . Smokeless tobacco: Never Used  . Alcohol use No  . Drug use: No  . Sexual activity: Not on file

## 2016-06-04 NOTE — Patient Instructions (Signed)
Avoid overhead lifting and overhead use of the arms. Do not lift greater than 5 lbs. Adjust head rest in vehicle to prevent hyperextension if rear ended. Take extra precautions to avoid falling, including use of a cane if you feel weak. Avoid bending, stooping and avoid lifting weights greater than 10 lbs. Avoid prolong standing and walking. Avoid frequent bending and stooping  No lifting greater than 10 lbs. May use ice or moist heat for pain. Weight loss is of benefit. Handicap license is approved. Obtain assist devices, sock applicator and long show horn to help with shoes and socks.

## 2016-07-18 ENCOUNTER — Telehealth (INDEPENDENT_AMBULATORY_CARE_PROVIDER_SITE_OTHER): Payer: Self-pay | Admitting: Specialist

## 2016-07-18 NOTE — Telephone Encounter (Signed)
Patient requesting Rx Steroid for inflammation for the lower back

## 2016-07-18 NOTE — Telephone Encounter (Signed)
Patient requesting Rx Steroid for inflammation for the lower back.  Please call into [facility] in [place].  She states she has taken the steroid in the past. She is aware that Louanne Skye is not in the office, but is requesting that someone send in if they can.

## 2016-07-21 ENCOUNTER — Telehealth (INDEPENDENT_AMBULATORY_CARE_PROVIDER_SITE_OTHER): Payer: Self-pay | Admitting: Specialist

## 2016-07-21 NOTE — Telephone Encounter (Signed)
Patient called needing a refill on hydrocodone and steroids. Shes going out of town for 3 weeks and leaving Wednesday and was needing these prescriptions before she leave.

## 2016-07-21 NOTE — Telephone Encounter (Signed)
Patient called needing a refill on hydrocodone and steroids. Shes going out of town for 3 weeks and leaving Wednesday and was needing these prescriptions before she leave. CB # [phone]

## 2016-07-22 ENCOUNTER — Telehealth (INDEPENDENT_AMBULATORY_CARE_PROVIDER_SITE_OTHER): Payer: Self-pay | Admitting: Specialist

## 2016-07-22 ENCOUNTER — Other Ambulatory Visit (INDEPENDENT_AMBULATORY_CARE_PROVIDER_SITE_OTHER): Payer: Self-pay | Admitting: Specialist

## 2016-07-22 DIAGNOSIS — M461 Sacroiliitis, not elsewhere classified: Secondary | ICD-10-CM

## 2016-07-22 DIAGNOSIS — M47816 Spondylosis without myelopathy or radiculopathy, lumbar region: Secondary | ICD-10-CM

## 2016-07-22 DIAGNOSIS — M545 Low back pain, unspecified: Secondary | ICD-10-CM

## 2016-07-22 DIAGNOSIS — M47812 Spondylosis without myelopathy or radiculopathy, cervical region: Secondary | ICD-10-CM

## 2016-07-22 DIAGNOSIS — M542 Cervicalgia: Secondary | ICD-10-CM

## 2016-07-22 DIAGNOSIS — M5136 Other intervertebral disc degeneration, lumbar region: Secondary | ICD-10-CM

## 2016-07-22 MED ORDER — METHYLPREDNISOLONE 4 MG PO TBPK: ORAL_TABLET | ORAL | 0 refills | Status: DC

## 2016-07-22 MED ORDER — HYDROCODONE-ACETAMINOPHEN 5-325 MG PO TABS: 1.0000 | ORAL_TABLET | Freq: Four times a day (QID) | ORAL | 0 refills | Status: DC | PRN

## 2016-07-22 NOTE — Telephone Encounter (Signed)
Rx renewed, [facility] web site reviewed and no new Rx since this last Rx

## 2016-07-22 NOTE — Telephone Encounter (Signed)
Rx renewed, [facility] web site reviewed and no new Rx since this last Rx 2/9.jen

## 2016-07-22 NOTE — Telephone Encounter (Signed)
Patient called asked if another doctor can assist with the Rx request for Hydrocodone and a steroid for inflammation of her lower spine . Patient said she is going out of town tomorrow morning and will be gone for 3 weeks. The number to contact her is [phone]

## 2016-07-22 NOTE — Telephone Encounter (Signed)
Rx for medrol dose pak sent to her pharmacy, but she should know that I will not renew this medication, it causes osteoporosis, decreased immunity to infections, and adrenal suppression or insufficiency if taken chronicly.jen

## 2016-07-23 NOTE — Telephone Encounter (Signed)
lmom that rx was ready for pick up at the front desk.  Also advised that rx for medrol couldn't be taken chronically.

## 2016-07-23 NOTE — Progress Notes (Signed)
lmom that rx was ready for pick up at the front desk.  Also advised that rx for medrol couldn't be taken chronically.

## 2016-08-07 DIAGNOSIS — H25012 Cortical age-related cataract, left eye: Secondary | ICD-10-CM | POA: Diagnosis not present

## 2016-08-07 DIAGNOSIS — H35033 Hypertensive retinopathy, bilateral: Secondary | ICD-10-CM | POA: Diagnosis not present

## 2016-08-07 DIAGNOSIS — H35372 Puckering of macula, left eye: Secondary | ICD-10-CM | POA: Diagnosis not present

## 2016-08-07 DIAGNOSIS — Z961 Presence of intraocular lens: Secondary | ICD-10-CM | POA: Diagnosis not present

## 2016-08-12 ENCOUNTER — Other Ambulatory Visit (INDEPENDENT_AMBULATORY_CARE_PROVIDER_SITE_OTHER): Payer: Self-pay | Admitting: Specialist

## 2016-08-12 NOTE — Telephone Encounter (Signed)
Norco refill requset

## 2016-08-13 NOTE — Telephone Encounter (Signed)
I approve of renewal but we will need to refer her to pain management as she does continue to require ongoing narcotic Prescription and surgery is not indicated at this time. jen

## 2016-08-14 NOTE — Telephone Encounter (Signed)
I called to advise patient that her rx was ready for pick up--however she states that she does NOT need it at this time that her pharm must have it on auto refill and sent it over, but she just got er last rx's last week and got them filled.  So she is fine on this meds for now.

## 2016-09-04 ENCOUNTER — Ambulatory Visit (INDEPENDENT_AMBULATORY_CARE_PROVIDER_SITE_OTHER): Payer: Medicare Other

## 2016-09-04 ENCOUNTER — Ambulatory Visit (INDEPENDENT_AMBULATORY_CARE_PROVIDER_SITE_OTHER): Payer: Medicare Other | Admitting: Specialist

## 2016-09-04 ENCOUNTER — Encounter (INDEPENDENT_AMBULATORY_CARE_PROVIDER_SITE_OTHER): Payer: Self-pay | Admitting: Specialist

## 2016-09-04 VITALS — BP 177/79 | HR 64 | Ht 61.0 in | Wt 185.0 lb

## 2016-09-04 DIAGNOSIS — M1990 Unspecified osteoarthritis, unspecified site: Secondary | ICD-10-CM

## 2016-09-04 DIAGNOSIS — M79641 Pain in right hand: Secondary | ICD-10-CM

## 2016-09-04 DIAGNOSIS — M545 Low back pain: Secondary | ICD-10-CM

## 2016-09-04 MED ORDER — DICLOFENAC SODIUM 1 % TD GEL: 4.0000 g | Freq: Four times a day (QID) | TRANSDERMAL | 3 refills | Status: DC

## 2016-09-04 NOTE — Patient Instructions (Addendum)
Avoid bending, stooping and avoid lifting weights greater than 10 lbs. Avoid prolong standing and walking. Avoid frequent bending and stooping  No lifting greater than 10 lbs. May use ice or moist heat for pain. Weight loss is of benefit. Handicap license is approved. Obtain rheumatology consult with Dr. 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  Voltaren gel transdermal as you are intolerant of oral NSAIDs. Apply 4 grams to the finger joints that are painful up to four times a day. Warm soaks.

## 2016-09-04 NOTE — Progress Notes (Addendum)
Office Visit Note   Patient: Jessica Gould           Date of Birth: 1946/04/15           MRN: [id number] Visit Date: 09/04/2016              Requested by: Rochel Brome, MD [address] [place], [place] [address] PCP: Rochel Brome, MD   Assessment & Plan: Visit Diagnoses:  1. Low back pain, unspecified back pain laterality, unspecified chronicity, with sciatica presence unspecified   2. Pain in right hand   3. Inflammatory arthritis     Plan: Avoid bending, stooping and avoid lifting weights greater than 10 lbs. Avoid prolong standing and walking. Avoid frequent bending and stooping  No lifting greater than 10 lbs. May use ice or moist heat for pain. Weight loss is of benefit. Handicap license is approved. Lab tests: RF, ANA,Uric Acid,CRP,Sed rate, anti-CCP antibody. Obtain rheumatology consult with Dr. 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  Voltaren gel transdermal as you are intolerant of oral NSAIDs. Apply 4 grams to the finger joints that are painful up to four times a day. Warm soaks.    Follow-Up Instructions: Return in about 4 weeks (around 10/02/2016).   Orders:  Orders Placed This Encounter  Procedures  . XR Lumbar Spine 2-3 Views  . XR Hand Complete Right  . Rheumatoid Factor  . Antinuclear Antib (ANA)  . Uric acid  . Sed Rate (ESR)  . C-reactive protein  . Cyclic citrul peptide antibody, IgG   No orders of the defined types were placed in this encounter.     Procedures: No procedures performed   Clinical Data: No additional findings.   Subjective: Chief Complaint  Patient presents with  . Lower Back - Follow-up, Pain    71 year old female with past history of L4-5 fusion for spondylolisthesis, and stenosis, more recent removal of hardware and adjacent level decompression for spinal stenosis at the adjacent levels L3-4. No with episodic back pain pain and radiation into the right upper  upper buttock, but also notes right hand greater than left hand swelling and pain into the right middle nuckles of the right index through little finger. Decreased grip into the right hand. History of bilateral shoulder pain . Left hand 90% normal and the right is 40%.     Review of Systems  Constitutional: Negative.   HENT: Negative.   Eyes: Negative.   Respiratory: Negative.   Cardiovascular: Negative.   Gastrointestinal: Negative.   Endocrine: Negative.   Genitourinary: Negative.   Musculoskeletal: Negative.   Skin: Negative.   Allergic/Immunologic: Negative.   Neurological: Negative.   Hematological: Negative.   Psychiatric/Behavioral: Negative.      Objective: Vital Signs: BP (!) 177/79 (BP Location: Left Arm, Patient Position: Sitting)   Pulse 64   Ht 5' 1"  (1.549 m)   Wt 185 lb (83.9 kg)   BMI 34.96 kg/m   Physical Exam  Constitutional: She is oriented to person, place, and time. She appears well-developed and well-nourished.  HENT:  Head: Normocephalic and atraumatic.  Eyes: EOM are normal. Pupils are equal, round, and reactive to light.  Neck: Normal range of motion. Neck supple.  Pulmonary/Chest: Effort normal and breath sounds normal.  Abdominal: Soft. Bowel sounds are normal.  Neurological: She is alert and oriented to person, place, and time.  Skin: Skin is warm and dry.  Psychiatric: She has a normal mood  and affect. Her behavior is normal. Judgment and thought content normal.    Back Exam   Tenderness  The patient is experiencing tenderness in the lumbar.  Range of Motion  Extension: abnormal  Flexion: abnormal  Lateral Bend Right: abnormal  Lateral Bend Left: abnormal  Rotation Right: abnormal  Rotation Left: abnormal   Muscle Strength  Right Quadriceps:  5/5  Left Quadriceps:  5/5  Right Hamstrings:  5/5  Left Hamstrings:  5/5   Tests  Straight leg raise right: negative Straight leg raise left: negative  Reflexes  Patellar:  normal Achilles: normal Babinski's sign: normal   Other  Toe Walk: normal Heel Walk: normal Sensation: normal Gait: normal  Erythema: no back redness Scars: absent      Specialty Comments:  No specialty comments available.  Imaging: Xr Hand Complete Right  Result Date: 09/04/2016 Right hand AP, lateral and oblique radiographs show narrowing of the right index, long and ring ulnar PIP joints with osteophytes and bone erosion at the insertion on the collateral ligaments along the proximal phalangeal neck and head laterally.  Xr Lumbar Spine 2-3 Views  Result Date: 09/04/2016 AP and lateral flexion and extension radiographs show solid fused L4-5 level anterior interbody fusion, moderate DDD adjacent L3-4 with anterior osteophytes over the disc at this level. Disc at L5-S1 is well maintained. Spondylosis of the bilateral facets L5-S1 and L3-4, posterolateral fusion appears intact and solid. No anterolisthesis    PMFS History: Patient Active Problem List   Diagnosis Date Noted  . Spinal stenosis of lumbar region 04/05/2013    Class: Diagnosis of  . ESSENTIAL HYPERTENSION, BENIGN 01/09/2009  . DIZZINESS 01/09/2009  . DYSLIPIDEMIA 01/05/2009  . ANXIETY 01/05/2009  . DEPRESSION 01/05/2009  . MIGRAINE HEADACHE 01/05/2009  . 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 SPINAL STENOSIS, LUMBAR 01/05/2009  . COLONIC POLYPS, HX OF 01/05/2009  . GASTROESOPHAGEAL REFLUX DISEASE, HX OF 01/05/2009   Past Medical History:  Diagnosis Date  . Arthritis   . CAD (coronary artery disease)   . GERD (gastroesophageal reflux disease)   . Headache(784.0)   . PONV (postoperative nausea and vomiting)    BP dropprd once  . Sleep apnea    do not use CPAP    Family History  Problem Relation Age of Onset  . Heart disease Father 62  . Cancer Father   . Rheumatologic disease Father   . Cancer Mother   . Heart disease Brother 81  . Heart disease Sister 15  . Rheumatologic disease Paternal Grandmother      Past Surgical History:  Procedure Laterality Date  . ABDOMINAL HYSTERECTOMY    . APPENDECTOMY    . BACK SURGERY  2004   lumbar  . CHOLECYSTECTOMY    . DIAGNOSTIC LAPAROSCOPY    . ESOPHAGEAL DILATION  03/31/2013  . HARDWARE REMOVAL N/A 04/04/2013   Procedure: HARDWARE REMOVAL;  Surgeon: Jessy Oto, MD;  Location: [place];  Service: Orthopedics;  Laterality: N/A;  . LUMBAR LAMINECTOMY N/A 04/04/2013   Procedure: Left L3-4 lateral recess decompression and Left L3-4 foraminotomy, Removal of Rod and Screws Left L4-5;  Surgeon: Jessy Oto, MD;  Location: [facility];  Service: Orthopedics;  Laterality: N/A;  . TONSILLECTOMY Right    nodals removed  . TUBAL LIGATION    . UPPER GI ENDOSCOPY     Social History   Occupational History  . Not on file.   Social History Main Topics  . Smoking status: Never Smoker  . Smokeless tobacco:  Never Used  . Alcohol use No  . Drug use: No  . Sexual activity: Not on file

## 2016-09-05 LAB — CYCLIC CITRUL PEPTIDE ANTIBODY, IGG: Cyclic Citrullin Peptide Ab: <16 Units

## 2016-09-05 LAB — URIC ACID: URIC ACID, SERUM: 3.3 mg/dL (ref 2.5–7.0)

## 2016-09-05 LAB — RHEUMATOID FACTOR: Rhuematoid fact SerPl-aCnc: <14 IU/mL (ref <14)

## 2016-09-05 LAB — C-REACTIVE PROTEIN: CRP: 4.4 mg/L (ref <8.0)

## 2016-09-05 LAB — ANA: ANA: NEGATIVE

## 2016-09-05 LAB — SEDIMENTATION RATE: SED RATE: 1 mm/h (ref 0–30)

## 2016-09-08 NOTE — Progress Notes (Signed)
Cardiology Office Note   Date:  09/09/2016   ID:  Jessica Gould, DOB 05-02-1946, MRN [id number] PCP:  Rochel Brome, MD  Cardiologist:   Minus Breeding, MD   Chief Complaint  Patient presents with  . Coronary Artery Disease      History of Present Illness: Jessica Gould is a 71 y.o. female who presents for evaluation after PCI. She was found to have 30% LAD stenosis. The circumflex had 95-99% proximal stenosis. The right coronary artery 30% stenosis. The EF 70%. She had DES stenting. She did have a small groin hematoma complicating this. She had esophageal pain following this and had to go back to the hospital. She has a problem with esophageal dysmotility.   Since I last saw her she has done well. He denies any chest pressure, neck or arm discomfort. Unfortunately her son died in 2023-06-04 and she's been grieving from this. She's not been exercising as much as she should. She denies any of her previous chest pain, neck or arm discomfort.     Past Medical History:  Diagnosis Date  . Arthritis   . CAD (coronary artery disease)   . GERD (gastroesophageal reflux disease)   . Headache(784.0)   . PONV (postoperative nausea and vomiting)    BP dropprd once  . Sleep apnea    do not use CPAP    Past Surgical History:  Procedure Laterality Date  . ABDOMINAL HYSTERECTOMY    . APPENDECTOMY    . BACK SURGERY  2004   lumbar  . CHOLECYSTECTOMY    . DIAGNOSTIC LAPAROSCOPY    . ESOPHAGEAL DILATION  03/31/2013  . HARDWARE REMOVAL N/A 04/04/2013   Procedure: HARDWARE REMOVAL;  Surgeon: Jessy Oto, MD;  Location: [facility];  Service: Orthopedics;  Laterality: N/A;  . LUMBAR LAMINECTOMY N/A 04/04/2013   Procedure: Left L3-4 lateral recess decompression and Left L3-4 foraminotomy, Removal of Rod and Screws Left L4-5;  Surgeon: Jessy Oto, MD;  Location: [facility];  Service: Orthopedics;  Laterality: N/A;  . TONSILLECTOMY Right    nodals removed  . TUBAL LIGATION    . UPPER GI  ENDOSCOPY       Current Outpatient Prescriptions  Medication Sig Dispense Refill  . aspirin EC 81 MG tablet Take 1 tablet (81 mg total) by mouth daily. 90 tablet 3  . clopidogrel (PLAVIX) 75 MG tablet Take 1 tablet (75 mg total) by mouth daily. 90 tablet 3  . HYDROcodone-acetaminophen (NORCO/VICODIN) 5-325 MG tablet Take 1 tablet by mouth every 6 (six) hours as needed for moderate pain. 60 tablet 0  . metoprolol succinate (TOPROL-XL) 100 MG 24 hr tablet Take 1 tablet (100 mg total) by mouth daily. 90 tablet 3  . nitroGLYCERIN (NITROSTAT) 0.4 MG SL tablet Place 1 tablet (0.4 mg total) under the tongue every 5 (five) minutes as needed for chest pain. 25 tablet 0  . pantoprazole (PROTONIX) 40 MG tablet Take 40 mg by mouth.    . sertraline (ZOLOFT) 50 MG tablet Take 1 tablet (50 mg total) by mouth daily.    . rosuvastatin (CRESTOR) 20 MG tablet Take 1 tablet (20 mg total) by mouth daily. 30 tablet 11   No current facility-administered medications for this visit.     Allergies:   Sulfamethoxazole-trimethoprim; Statins; Duloxetine; Other; Oxycontin [oxycodone hcl]; Pregabalin; Erythromycin; Penicillins; and Phenobarbital    ROS:  Please see the history of present illness.   Otherwise, review of systems are positive for none.  All other systems are reviewed and negative.    PHYSICAL EXAM: VS:  BP 136/74   Pulse 60   Ht 5\' 1"  (1.549 m)   Wt 193 lb (87.5 kg)   BMI 36.47 kg/m  , BMI Body mass index is 36.47 kg/m. GENERAL:  Well appearing NECK:  No jugular venous distention, waveform within normal limits, carotid upstroke brisk and symmetric, no bruits, no thyromegaly LUNGS:  Clear to auscultation bilaterally BACK:  No CVA tenderness HEART:  PMI not displaced or sustained,S1 and S2 within normal limits, no S3, no S4, no clicks, no rubs, no murmurs ABD:  Flat, positive bowel sounds normal in frequency in pitch, no bruits, no rebound, no guarding, no midline pulsatile mass, no hepatomegaly,  no splenomegaly EXT:  2 plus pulses throughout, no edema, no cyanosis no clubbing    EKG:  EKG is not ordered today.   Recent Labs: No results found for requested labs within last 8760 hours.    Lipid Panel No results found for: CHOL, TRIG, HDL, CHOLHDL, VLDL, LDLCALC, LDLDIRECT    Wt Readings from Last 3 Encounters:  09/09/16 193 lb (87.5 kg)  09/04/16 185 lb (83.9 kg)  06/04/16 186 lb (84.4 kg)      Other studies Reviewed: Additional studies/ records that were reviewed today include: none Review of the above records demonstrates:      ASSESSMENT AND PLAN:  CAD:  The patient is doing  well. She will continue DAPT until Sept.  I will see her back prior to stopping this.  She needs to start exercising.    DYSLIPIDEMIA:  She's tolerating her current statins and will continue on this. We'll try to get the results of her most recent lipid profile.  HTN:  The blood pressure is at target. No change in medications is indicated.    Current medicines are reviewed at length with the patient today.  The patient does not have concerns regarding medicines.  The following changes have been made:  None  Labs/ tests ordered today include: None  No orders of the defined types were placed in this encounter.    Disposition:   FU with me in  12  months    Signed, Minus Breeding, MD  09/09/2016 9:09 PM    [facility]

## 2016-09-09 ENCOUNTER — Encounter: Payer: Self-pay | Admitting: Cardiology

## 2016-09-09 ENCOUNTER — Ambulatory Visit (INDEPENDENT_AMBULATORY_CARE_PROVIDER_SITE_OTHER): Payer: Medicare Other | Admitting: Cardiology

## 2016-09-09 VITALS — BP 136/74 | HR 60 | Ht 61.0 in | Wt 193.0 lb

## 2016-09-09 DIAGNOSIS — I10 Essential (primary) hypertension: Secondary | ICD-10-CM | POA: Diagnosis not present

## 2016-09-09 DIAGNOSIS — I2510 Atherosclerotic heart disease of native coronary artery without angina pectoris: Secondary | ICD-10-CM

## 2016-09-09 DIAGNOSIS — E785 Hyperlipidemia, unspecified: Secondary | ICD-10-CM

## 2016-09-09 MED ORDER — NITROGLYCERIN 0.4 MG SL SUBL: 0.4000 mg | SUBLINGUAL_TABLET | SUBLINGUAL | 0 refills | Status: AC | PRN

## 2016-09-09 MED ORDER — METOPROLOL SUCCINATE ER 100 MG PO TB24: 100.0000 mg | ORAL_TABLET | Freq: Every day | ORAL | 3 refills | Status: DC

## 2016-09-09 NOTE — Patient Instructions (Signed)
Medication Instructions:  INCREASE- Metoprolol XL 100 mg daily  Labwork: None Ordered  Testing/Procedures: None Ordered  Follow-Up: Your physician recommends that you schedule a follow-up appointment in: 4 Months   Any Other Special Instructions Will Be Listed Below (If Applicable).   If you need a refill on your cardiac medications before your next appointment, please call your pharmacy.

## 2016-09-11 ENCOUNTER — Telehealth (INDEPENDENT_AMBULATORY_CARE_PROVIDER_SITE_OTHER): Payer: Self-pay

## 2016-09-11 NOTE — Telephone Encounter (Signed)
Patient called concerning her lab results from her last office visit and would like to know about a referral to Dr. Estanislado Pandy.  CB# is [phone]

## 2016-09-12 NOTE — Telephone Encounter (Incomplete)
Patient is calling about her lab results, and about the referral to Dr. Diana Eves don't see a referral in the chart for

## 2016-09-12 NOTE — Telephone Encounter (Signed)
Patient is calling about her lab results, and about the referral to Dr. Diana Eves don't see a referral in the chart for Dr. Zenda Alpers advise--

## 2016-09-15 ENCOUNTER — Other Ambulatory Visit (INDEPENDENT_AMBULATORY_CARE_PROVIDER_SITE_OTHER): Payer: Self-pay | Admitting: Specialist

## 2016-09-15 DIAGNOSIS — M79641 Pain in right hand: Secondary | ICD-10-CM

## 2016-09-15 NOTE — Telephone Encounter (Signed)
I placed a formal referral for Dr. Estanislado Pandy for a rheumatology eval. I think I placed the request just in the note for the front desk at patient's exit from office with her return time. jen

## 2016-09-16 DIAGNOSIS — R7301 Impaired fasting glucose: Secondary | ICD-10-CM | POA: Diagnosis not present

## 2016-09-16 DIAGNOSIS — G4733 Obstructive sleep apnea (adult) (pediatric): Secondary | ICD-10-CM | POA: Diagnosis not present

## 2016-09-16 DIAGNOSIS — M19041 Primary osteoarthritis, right hand: Secondary | ICD-10-CM | POA: Diagnosis not present

## 2016-09-16 DIAGNOSIS — R5383 Other fatigue: Secondary | ICD-10-CM | POA: Diagnosis not present

## 2016-09-16 DIAGNOSIS — Z7289 Other problems related to lifestyle: Secondary | ICD-10-CM | POA: Diagnosis not present

## 2016-09-16 DIAGNOSIS — I10 Essential (primary) hypertension: Secondary | ICD-10-CM | POA: Diagnosis not present

## 2016-09-16 DIAGNOSIS — I2510 Atherosclerotic heart disease of native coronary artery without angina pectoris: Secondary | ICD-10-CM | POA: Diagnosis not present

## 2016-09-17 ENCOUNTER — Other Ambulatory Visit: Payer: Self-pay | Admitting: Family Medicine

## 2016-09-17 DIAGNOSIS — Z1231 Encounter for screening mammogram for malignant neoplasm of breast: Secondary | ICD-10-CM

## 2016-09-17 NOTE — Telephone Encounter (Signed)
Patient has appt with Dr. Estanislado Pandy on 09/26/16

## 2016-09-21 NOTE — Progress Notes (Signed)
Office Visit Note  Patient: Jessica Gould             Date of Birth: 1946/01/07           MRN: [id number]             PCP: Rochel Brome, MD Referring: Jessy Oto, MD Visit Date: 09/26/2016 Occupation: @GUAROCC @    Subjective:  Pain in hands   History of Present Illness: Jessica Gould is a 71 y.o. female seen in consultation per request of Dr. Louanne Skye. According to patient she's had problems with right hand pain and discomfort for the last 3 years. She states over time she has noticed change in shape of her fingers. She has episodes of increased pain and swelling with nocturnal pain. Currently her hands are doing better. She states in the last 3 months she's also had some discomfort in her left middle finger. None of the other joints are is painful. She has chronic lower back pain. She also had right shoulder joint surgery after a skiing accident. Which was several years ago. She mentions that she has generalized pain for multiple years but not in her joints.  She reports a recent oral ulcers and some rash on her upper extremities.  Activities of Daily Living:  Patient reports morning stiffness for 5 minutes.   Patient Reports nocturnal pain.  Difficulty dressing/grooming: Denies Difficulty climbing stairs: Reports Difficulty getting out of chair: Reports Difficulty using hands for taps, buttons, cutlery, and/or writing: Reports   Review of Systems  Constitutional: Positive for fatigue and weakness. Negative for night sweats, weight gain and weight loss.  HENT: Positive for mouth sores. Negative for trouble swallowing, trouble swallowing, mouth dryness and nose dryness.   Eyes: Positive for dryness. Negative for pain, redness and visual disturbance.  Respiratory: Positive for cough. Negative for shortness of breath and difficulty breathing.   Cardiovascular: Negative for chest pain, palpitations, hypertension, irregular heartbeat and swelling in legs/feet.    Gastrointestinal: Negative for blood in stool, constipation and diarrhea.  Endocrine: Negative for increased urination.  Genitourinary: Negative for vaginal dryness.  Musculoskeletal: Positive for arthralgias, joint pain, joint swelling, myalgias, morning stiffness and myalgias. Negative for muscle weakness and muscle tenderness.  Skin: Positive for rash. Negative for color change, hair loss, skin tightness, ulcers and sensitivity to sunlight.  Allergic/Immunologic: Negative for susceptible to infections.  Neurological: Negative for dizziness, memory loss and night sweats.  Hematological: Negative for swollen glands.  Psychiatric/Behavioral: Positive for depressed mood and sleep disturbance. The patient is not nervous/anxious.     PMFS History:  Patient Active Problem List   Diagnosis Date Noted  . Dyslipidemia 09/26/2016  . History of anxiety 09/26/2016  . History of depression 09/26/2016  . History of migraine headaches 09/26/2016  . History of coronary artery disease 09/26/2016  . Spinal stenosis of lumbar region 04/05/2013    Class: Diagnosis of  . ESSENTIAL HYPERTENSION, BENIGN 01/09/2009  . DIZZINESS 01/09/2009  . DYSLIPIDEMIA 01/05/2009  . ANXIETY 01/05/2009  . DEPRESSION 01/05/2009  . MIGRAINE HEADACHE 01/05/2009  . 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 SPINAL STENOSIS, LUMBAR 01/05/2009  . COLONIC POLYPS, HX OF 01/05/2009  . GASTROESOPHAGEAL REFLUX DISEASE, HX OF 01/05/2009    Past Medical History:  Diagnosis Date  . Arthritis   . CAD (coronary artery disease)   . GERD (gastroesophageal reflux disease)   . Headache(784.0)   . PONV (postoperative nausea and vomiting)    BP dropprd once  . Sleep apnea  do not use CPAP    Family History  Problem Relation Age of Onset  . Heart disease Father 38  . Cancer Father   . Rheumatologic disease Father   . Cancer Mother   . Heart disease Brother 32  . Heart disease Sister 48  . Rheumatologic disease Paternal Grandmother    Past  Surgical History:  Procedure Laterality Date  . ABDOMINAL HYSTERECTOMY    . APPENDECTOMY    . BACK SURGERY  2004   lumbar  . CHOLECYSTECTOMY    . DIAGNOSTIC LAPAROSCOPY    . ESOPHAGEAL DILATION  03/31/2013  . HARDWARE REMOVAL N/A 04/04/2013   Procedure: HARDWARE REMOVAL;  Surgeon: Jessy Oto, MD;  Location: [facility];  Service: Orthopedics;  Laterality: N/A;  . LUMBAR LAMINECTOMY N/A 04/04/2013   Procedure: Left L3-4 lateral recess decompression and Left L3-4 foraminotomy, Removal of Rod and Screws Left L4-5;  Surgeon: Jessy Oto, MD;  Location: [facility];  Service: Orthopedics;  Laterality: N/A;  . TONSILLECTOMY Right    nodals removed  . TUBAL LIGATION    . UPPER GI ENDOSCOPY     Social History   Social History Narrative  . No narrative on file     Objective: Vital Signs: BP (!) 154/77 (BP Location: Right Arm, Patient Position: Sitting, Cuff Size: Normal)   Pulse 66   Resp 15   Ht 5' 1"  (1.549 m)   Wt 195 lb (88.5 kg)   BMI 36.84 kg/m    Physical Exam  Constitutional: She is oriented to person, place, and time. She appears well-developed and well-nourished.  HENT:  Head: Normocephalic and atraumatic.  Eyes: Conjunctivae and EOM are normal.  Neck: Normal range of motion.  Cardiovascular: Normal rate, regular rhythm, normal heart sounds and intact distal pulses.   Pulmonary/Chest: Effort normal and breath sounds normal.  Abdominal: Soft. Bowel sounds are normal.  Lymphadenopathy:    She has no cervical adenopathy.  Neurological: She is alert and oriented to person, place, and time.  Skin: Skin is warm and dry. Capillary refill takes less than 2 seconds. Rash noted. There is erythema.  erythematous macular rash on bilateral forearm  Psychiatric: She has a normal mood and affect. Her behavior is normal.  Nursing note and vitals reviewed.    Musculoskeletal Exam: C-spine good range of motion thoracic and lumbar spine good range of motion. Shoulder joints elbow joints  wrist joint MCPs PIPs were good range of motion. She has thickening of some right hand PIP joints with no obvious synovitis on examination. She also has some thickening of left third PIP joint. No DIP changes were noted no nail atrophy or pitting was noted. Hip joints knee joints ankles MTPs PIPs with good range of motion with no synovitis.   CDAI Exam: No CDAI exam completed.    Investigation: No additional findings.   Imaging: Xr Hand 2 View Left  Result Date: 09/26/2016 Minimal PIP/DIP narrowing with spurring. No MCP changes intercarpal joints changes or erosive changes were noted. 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  Xr Lumbar Spine 2-3 Views  Result Date: 09/04/2016 AP and lateral flexion and extension radiographs show solid fused L4-5 level anterior interbody fusion, moderate DDD  adjacent L3-4 with anterior osteophytes over the disc at this level. Vacuum sign L3-4. Disc at L5-S1 is well maintained. Spondylosis of the bilateral facets L5-S1 and L3-4, posterolateral fusion appears intact and solid. No anterolisthesis   Speciality Comments: No specialty comments available. 09/04/2016 RF negative, CCP negative, ESR 1, CRP normal, ANA negative, uric acid 3.3   Procedures:  No procedures performed Allergies: Sulfamethoxazole-trimethoprim; Statins; Duloxetine; Other; Oxycontin [oxycodone hcl]; Pregabalin; Erythromycin; Penicillins; and Phenobarbital   Assessment / Plan:     Visit Diagnoses: Pain of right hand: Patient appears to have thickening of her PIP joints in her right hand with decreased range of motion. I reviewed her x-ray of her right hand today which showed  some cystic changes in her PIP joints and spurring which could be consistent with inflammatory osteoarthritis. Although psoriatic arthritis comes in the differential along with gout. Her uric acid was within normal limits. I'll schedule ultrasound to evaluate this further. I also offered a Voltaren gel prescription but she declined. I will schedule ultrasound of her bilateral hands to look for synovitis.   Pain in left hand - Plan: XR Hand 2 View Left  Rash: Advised her to see dermatologist for ongoing rash.  She also had a an oral ulcer on examination today she states is an infrequent finding for her. I'll give her a prescription for Magic mouthwash.   SPINAL STENOSIS, LUMBAR - S/p surg x3 by Dr. Louanne Skye: Chronic pain   Essential hypertension, benign  Dyslipidemia  History of anxiety  History of depression  History of migraine headaches  History of coronary artery disease - Status post stent placement    Orders: Orders Placed This Encounter  Procedures  . XR Hand 2 View Left  . CBC with Differential/Platelet  . COMPLETE METABOLIC PANEL WITH GFR  . HLA-B27 antigen  . Pan-ANCA   Meds ordered this encounter  Medications  . magic mouthwash w/lidocaine SOLN    Sig: Take 5 mLs by mouth 4 (four) times daily.    Dispense:  120 mL    Refill:  0    Face-to-face time spent with patient was 50 minutes. 50% of time was spent in counseling and coordination of care.  Follow-Up Instructions: Return for Osteoarthritis,rash.   Bo Merino, MD  Note - This record has been created using Editor, commissioning.  Chart creation errors have been sought, but may not always  have been located. Such creation errors do not reflect on  the standard of medical care.

## 2016-09-26 ENCOUNTER — Encounter: Payer: Self-pay | Admitting: Rheumatology

## 2016-09-26 ENCOUNTER — Ambulatory Visit (INDEPENDENT_AMBULATORY_CARE_PROVIDER_SITE_OTHER): Payer: Medicare Other

## 2016-09-26 ENCOUNTER — Ambulatory Visit (INDEPENDENT_AMBULATORY_CARE_PROVIDER_SITE_OTHER): Payer: Medicare Other | Admitting: Rheumatology

## 2016-09-26 VITALS — BP 154/77 | HR 66 | Resp 15 | Ht 61.0 in | Wt 195.0 lb

## 2016-09-26 DIAGNOSIS — M79642 Pain in left hand: Secondary | ICD-10-CM

## 2016-09-26 DIAGNOSIS — I2510 Atherosclerotic heart disease of native coronary artery without angina pectoris: Secondary | ICD-10-CM

## 2016-09-26 DIAGNOSIS — I10 Essential (primary) hypertension: Secondary | ICD-10-CM | POA: Diagnosis not present

## 2016-09-26 DIAGNOSIS — K1379 Other lesions of oral mucosa: Secondary | ICD-10-CM

## 2016-09-26 DIAGNOSIS — Z8659 Personal history of other mental and behavioral disorders: Secondary | ICD-10-CM | POA: Diagnosis not present

## 2016-09-26 DIAGNOSIS — M79641 Pain in right hand: Secondary | ICD-10-CM

## 2016-09-26 DIAGNOSIS — M48061 Spinal stenosis, lumbar region without neurogenic claudication: Secondary | ICD-10-CM

## 2016-09-26 DIAGNOSIS — R21 Rash and other nonspecific skin eruption: Secondary | ICD-10-CM

## 2016-09-26 DIAGNOSIS — E785 Hyperlipidemia, unspecified: Secondary | ICD-10-CM

## 2016-09-26 DIAGNOSIS — Z8679 Personal history of other diseases of the circulatory system: Secondary | ICD-10-CM | POA: Diagnosis not present

## 2016-09-26 DIAGNOSIS — Z8669 Personal history of other diseases of the nervous system and sense organs: Secondary | ICD-10-CM | POA: Diagnosis not present

## 2016-09-26 LAB — CBC WITH DIFFERENTIAL/PLATELET
BASOS PCT: 0 %
Basophils Absolute: 0 cells/uL (ref 0–200)
EOS ABS: 276 {cells}/uL (ref 15–500)
Eosinophils Relative: 4 %
HCT: 42.3 % (ref 35.0–45.0)
Hemoglobin: 13.7 g/dL (ref 11.7–15.5)
Lymphocytes Relative: 21 %
Lymphs Abs: 1449 cells/uL (ref 850–3900)
MCH: 29.9 pg (ref 27.0–33.0)
MCHC: 32.4 g/dL (ref 32.0–36.0)
MCV: 92.4 fL (ref 80.0–100.0)
MONOS PCT: 8 %
MPV: 11 fL (ref 7.5–12.5)
Monocytes Absolute: 552 cells/uL (ref 200–950)
NEUTROS ABS: 4623 {cells}/uL (ref 1500–7800)
Neutrophils Relative %: 67 %
PLATELETS: 206 10*3/uL (ref 140–400)
RBC: 4.58 MIL/uL (ref 3.80–5.10)
RDW: 14.2 % (ref 11.0–15.0)
WBC: 6.9 10*3/uL (ref 3.8–10.8)

## 2016-09-26 MED ORDER — MAGIC MOUTHWASH W/LIDOCAINE: 5.0000 mL | Freq: Four times a day (QID) | ORAL | 0 refills | Status: DC

## 2016-09-26 NOTE — Progress Notes (Signed)
Patient was prescribed magic mouthwash during today's visit.  Counseled patient on the purpose, proper use, and adverse effects of magic mouthwash.    Patient also reports she was prescribed Voltaren gel by Dr. Louanne Skye, but was unable to afford the medication.  I gave patient a GoodRx coupon card and discussed use of coupon card to assist with copay cost.  Patient voiced understanding and denied any further question or concerns regarding her medications at this time.   Elisabeth Most, Pharm.D., BCPS, CPP Clinical Pharmacist Pager: [phone] Phone: [phone] 09/26/2016 11:24 AM

## 2016-09-27 LAB — COMPLETE METABOLIC PANEL WITH GFR
ALT: 20 U/L (ref 6–29)
AST: 23 U/L (ref 10–35)
Albumin: 4.1 g/dL (ref 3.6–5.1)
Alkaline Phosphatase: 93 U/L (ref 33–130)
BILIRUBIN TOTAL: 0.4 mg/dL (ref 0.2–1.2)
BUN: 19 mg/dL (ref 7–25)
CHLORIDE: 106 mmol/L (ref 98–110)
CO2: 23 mmol/L (ref 20–31)
CREATININE: 1.03 mg/dL — AB (ref 0.60–0.93)
Calcium: 9.5 mg/dL (ref 8.6–10.4)
GFR, Est African American: 63 mL/min (ref >=60)
GFR, Est Non African American: 54 mL/min — ABNORMAL LOW (ref >=60)
GLUCOSE: 87 mg/dL (ref 65–99)
Potassium: 4.4 mmol/L (ref 3.5–5.3)
SODIUM: 141 mmol/L (ref 135–146)
TOTAL PROTEIN: 6.4 g/dL (ref 6.1–8.1)

## 2016-09-30 LAB — PAN-ANCA
ANCA Screen: NEGATIVE
Myeloperoxidase Abs: <1

## 2016-10-03 ENCOUNTER — Other Ambulatory Visit (INDEPENDENT_AMBULATORY_CARE_PROVIDER_SITE_OTHER): Payer: Self-pay | Admitting: Specialist

## 2016-10-07 ENCOUNTER — Ambulatory Visit
Admission: RE | Admit: 2016-10-07 | Discharge: 2016-10-07 | Disposition: A | Discharge status: Home / Self Care | Payer: Medicare Other | Source: Ambulatory Visit | Attending: Family Medicine | Admitting: Family Medicine

## 2016-10-07 DIAGNOSIS — Z1231 Encounter for screening mammogram for malignant neoplasm of breast: Secondary | ICD-10-CM

## 2016-10-07 LAB — HLA-B27 ANTIGEN: DNA Result:: NEGATIVE

## 2016-10-07 NOTE — Progress Notes (Signed)
Will discuss at fu

## 2016-10-13 ENCOUNTER — Encounter (INDEPENDENT_AMBULATORY_CARE_PROVIDER_SITE_OTHER): Payer: Self-pay | Admitting: Specialist

## 2016-10-13 ENCOUNTER — Ambulatory Visit (INDEPENDENT_AMBULATORY_CARE_PROVIDER_SITE_OTHER): Payer: Medicare Other | Admitting: Specialist

## 2016-10-13 DIAGNOSIS — I2510 Atherosclerotic heart disease of native coronary artery without angina pectoris: Secondary | ICD-10-CM | POA: Diagnosis not present

## 2016-10-13 DIAGNOSIS — M545 Low back pain, unspecified: Secondary | ICD-10-CM

## 2016-10-13 DIAGNOSIS — M461 Sacroiliitis, not elsewhere classified: Secondary | ICD-10-CM | POA: Diagnosis not present

## 2016-10-13 DIAGNOSIS — M542 Cervicalgia: Secondary | ICD-10-CM

## 2016-10-13 DIAGNOSIS — M47816 Spondylosis without myelopathy or radiculopathy, lumbar region: Secondary | ICD-10-CM

## 2016-10-13 DIAGNOSIS — M47812 Spondylosis without myelopathy or radiculopathy, cervical region: Secondary | ICD-10-CM

## 2016-10-13 DIAGNOSIS — E782 Mixed hyperlipidemia: Secondary | ICD-10-CM | POA: Diagnosis not present

## 2016-10-13 MED ORDER — HYDROCODONE-ACETAMINOPHEN 5-325 MG PO TABS: 1.0000 | ORAL_TABLET | Freq: Four times a day (QID) | ORAL | 0 refills | Status: DC | PRN

## 2016-10-13 MED ORDER — NAPROXEN 125 MG/5ML PO SUSP: 250.0000 mg | Freq: Two times a day (BID) | ORAL | 3 refills | Status: DC | PRN

## 2016-10-13 NOTE — Progress Notes (Signed)
Office Visit Note   Patient: Jessica Gould           Date of Birth: 30-Oct-1945           MRN: [id number] Visit Date: 10/13/2016              Requested by: Rochel Brome, MD [address] [place],  [address] PCP: Rochel Brome, MD   Assessment & Plan: Visit Diagnoses:  1. Acute midline low back pain without sciatica   2. Cervicalgia   3. Spondylosis without myelopathy or radiculopathy, cervical region   4. Spondylosis without myelopathy or radiculopathy, lumbar region   5. Sacroiliitis, not elsewhere classified ([place])     Plan: Avoid frequent bending and stooping  No lifting greater than 10 lbs. May use ice or moist heat for pain. Weight loss is of benefit. Handicap license is approved.  Avoid overhead lifting and overhead use of the arms. Do not lift greater than 5 lbs. Adjust head rest in vehicle to prevent hyperextension if rear ended. Naprosyn 2 teaspoonfuls twice a day with meal and drink a large glass of water.  Hydrocodone if naprosyn doesn't help pain.   Follow-Up Instructions: No Follow-up on file.   Orders:  No orders of the defined types were placed in this encounter.  No orders of the defined types were placed in this encounter.     Procedures: No procedures performed   Clinical Data: No additional findings.   Subjective: Chief Complaint  Patient presents with  . Lower Back - Follow-up    71 year old female with history of low back pain and degeneretive disc disease at the L3-4, above a fusion site L4-5 with pedicle screws and rods. Scheduled for botox injection For achalasia and esophageal stricture. She has had manometric tests with inability to get past a stricture in the esophagus. Has trouble with swallowing. Does understand that she is  Likely to have chronic ongoing pain. Has occasional irritation of the esophagus.    Review of Systems  Constitutional: Negative.   HENT: Negative.   Eyes: Negative.   Respiratory:  Negative.   Cardiovascular: Negative.   Gastrointestinal: Negative.   Endocrine: Negative.   Genitourinary: Negative.   Musculoskeletal: Negative.   Skin: Negative.   Allergic/Immunologic: Negative.   Neurological: Negative.   Hematological: Negative.   Psychiatric/Behavioral: Negative.      Objective: Vital Signs: BP 140/75 (BP Location: Right Arm, Patient Position: Sitting)   Pulse (!) 56   Ht 5\' 1"  (1.549 m)   Wt 193 lb (87.5 kg)   BMI 36.47 kg/m   Physical Exam  Constitutional: She is oriented to person, place, and time. She appears well-developed and well-nourished.  HENT:  Head: Normocephalic and atraumatic.  Eyes: EOM are normal. Pupils are equal, round, and reactive to light.  Neck: Normal range of motion. Neck supple.  Pulmonary/Chest: Effort normal and breath sounds normal.  Abdominal: Soft. Bowel sounds are normal.  Neurological: She is alert and oriented to person, place, and time.  Skin: Skin is warm and dry.  Psychiatric: She has a normal mood and affect. Her behavior is normal. Judgment and thought content normal.    Back Exam   Tenderness  The patient is experiencing tenderness in the lumbar.  Range of Motion  Flexion: abnormal  Lateral Bend Right: abnormal  Lateral Bend Left: abnormal  Rotation Right: abnormal  Rotation Left: abnormal   Muscle Strength  Right Quadriceps:  5/5  Left Quadriceps:  5/5  Right Hamstrings:  5/5  Left Hamstrings:  5/5   Tests  Straight leg raise right: negative Straight leg raise left: negative  Reflexes  Patellar: normal Achilles: normal Babinski's sign: normal   Other  Toe Walk: normal Heel Walk: normal   Left Shoulder Exam   Tenderness  The patient is experiencing tenderness in the acromion.  Range of Motion  Active Abduction: normal  Passive Abduction: normal  Extension: normal  Forward Flexion: normal  External Rotation: normal  Internal Rotation 0 degrees: normal  Internal Rotation 90  degrees: normal   Muscle Strength  Abduction: 5/5  Internal Rotation: 5/5  External Rotation: 5/5  Supraspinatus: 5/5  Subscapularis: 5/5  Biceps: 5/5   Tests  Apprehension: negative Cross Arm: positive Drop Arm: negative Hawkin's test: negative Impingement: positive Sulcus: absent  Other  Erythema: absent Scars: absent Sensation: normal Pulse: present       Specialty Comments:  No specialty comments available.  Imaging: No results found.   PMFS History: Patient Active Problem List   Diagnosis Date Noted  . Dyslipidemia 09/26/2016  . History of anxiety 09/26/2016  . History of depression 09/26/2016  . History of migraine headaches 09/26/2016  . History of coronary artery disease 09/26/2016  . Spinal stenosis of lumbar region 04/05/2013    Class: Diagnosis of  . ESSENTIAL HYPERTENSION, BENIGN 01/09/2009  . DIZZINESS 01/09/2009  . DYSLIPIDEMIA 01/05/2009  . ANXIETY 01/05/2009  . DEPRESSION 01/05/2009  . MIGRAINE HEADACHE 01/05/2009  . 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 SPINAL STENOSIS, LUMBAR 01/05/2009  . COLONIC POLYPS, HX OF 01/05/2009  . GASTROESOPHAGEAL REFLUX DISEASE, HX OF 01/05/2009   Past Medical History:  Diagnosis Date  . Arthritis   . CAD (coronary artery disease)   . GERD (gastroesophageal reflux disease)   . Headache(784.0)   . PONV (postoperative nausea and vomiting)    BP dropprd once  . Sleep apnea    do not use CPAP    Family History  Problem Relation Age of Onset  . Heart disease Father 48  . Cancer Father   . Rheumatologic disease Father   . Cancer Mother   . Heart disease Brother 58  . Heart disease Sister 97  . Rheumatologic disease Paternal Grandmother     Past Surgical History:  Procedure Laterality Date  . ABDOMINAL HYSTERECTOMY    . APPENDECTOMY    . BACK SURGERY  2004   lumbar  . BREAST BIOPSY Left 06/23/2006  . BREAST BIOPSY Left 04/12/2003  . BREAST EXCISIONAL BIOPSY Left 07/13/2006  . BREAST EXCISIONAL BIOPSY Left  2004  . CHOLECYSTECTOMY    . DIAGNOSTIC LAPAROSCOPY    . ESOPHAGEAL DILATION  03/31/2013  . HARDWARE REMOVAL N/A 04/04/2013   Procedure: HARDWARE REMOVAL;  Surgeon: Jessy Oto, MD;  Location: [facility];  Service: Orthopedics;  Laterality: N/A;  . LUMBAR LAMINECTOMY N/A 04/04/2013   Procedure: Left L3-4 lateral recess decompression and Left L3-4 foraminotomy, Removal of Rod and Screws Left L4-5;  Surgeon: Jessy Oto, MD;  Location: [facility];  Service: Orthopedics;  Laterality: N/A;  . TONSILLECTOMY Right    nodals removed  . TUBAL LIGATION    . UPPER GI ENDOSCOPY     Social History   Occupational History  . Not on file.   Social History Main Topics  . Smoking status: Never Smoker  . Smokeless tobacco: Never Used  . Alcohol use No  . Drug use: No  . Sexual activity: Not on file

## 2016-10-13 NOTE — Patient Instructions (Addendum)
Avoid frequent bending and stooping  No lifting greater than 10 lbs. May use ice or moist heat for pain. Weight loss is of benefit. Handicap license is approved.  Avoid overhead lifting and overhead use of the arms. Do not lift greater than 5 lbs. Adjust head rest in vehicle to prevent hyperextension if rear ended. Naprosyn 2 teaspoonfuls twice a day with meal and drink a large glass of water.  Hydrocodone if naprosyn doesn't help pain.

## 2016-10-16 DIAGNOSIS — Z6836 Body mass index (BMI) 36.0-36.9, adult: Secondary | ICD-10-CM | POA: Diagnosis not present

## 2016-10-16 DIAGNOSIS — G4733 Obstructive sleep apnea (adult) (pediatric): Secondary | ICD-10-CM | POA: Diagnosis not present

## 2016-10-16 DIAGNOSIS — E663 Overweight: Secondary | ICD-10-CM | POA: Diagnosis not present

## 2016-10-16 DIAGNOSIS — M810 Age-related osteoporosis without current pathological fracture: Secondary | ICD-10-CM | POA: Diagnosis not present

## 2016-10-16 DIAGNOSIS — K120 Recurrent oral aphthae: Secondary | ICD-10-CM | POA: Diagnosis not present

## 2016-10-16 DIAGNOSIS — Z0001 Encounter for general adult medical examination with abnormal findings: Secondary | ICD-10-CM | POA: Diagnosis not present

## 2016-10-17 ENCOUNTER — Telehealth: Payer: Self-pay | Admitting: *Deleted

## 2016-10-17 ENCOUNTER — Telehealth (INDEPENDENT_AMBULATORY_CARE_PROVIDER_SITE_OTHER): Payer: Self-pay | Admitting: Rheumatology

## 2016-10-17 NOTE — Telephone Encounter (Signed)
Spoke with [name], letting her know I spoke with Dr Percival Spanish and Gillermina Phy D, and the both agree that she can do the placebo-Controlled study.

## 2016-10-17 NOTE — Telephone Encounter (Signed)
09/26/2016 LABS FAXED TO [facility] [phone]

## 2016-10-17 NOTE — Telephone Encounter (Signed)
April labs faxed [facility]

## 2016-10-23 DIAGNOSIS — J020 Streptococcal pharyngitis: Secondary | ICD-10-CM | POA: Diagnosis not present

## 2016-10-28 ENCOUNTER — Ambulatory Visit: Payer: Medicare Other | Admitting: Rheumatology

## 2016-10-29 DIAGNOSIS — M810 Age-related osteoporosis without current pathological fracture: Secondary | ICD-10-CM | POA: Diagnosis not present

## 2016-10-29 DIAGNOSIS — M85852 Other specified disorders of bone density and structure, left thigh: Secondary | ICD-10-CM | POA: Diagnosis not present

## 2016-10-31 NOTE — Progress Notes (Signed)
 Office Visit Note  Patient: Jessica Gould             Date of Birth: 08/24/1945           MRN: [id number]             PCP: Cox, Kirsten, MD Referring: Cox, Kirsten, MD Visit Date: 11/04/2016 Occupation: @GUAROCC@    Subjective:  Right-handed pain   History of Present Illness: Jessica Gould is a 71 y.o. female she continues to have some discomfort in her bilateral hands. Right hand more so than the right . She has intermittent swelling in her right hand. The rash resolve by itself. She didn't get to see the dermatologist. The sores in her mouth and resolved as well. She continues to have some lower back discomfort.  Activities of Daily Living:  Patient reports morning stiffness for 30 minutes.   Patient Reports nocturnal pain.  Difficulty dressing/grooming: Denies Difficulty climbing stairs: Denies Difficulty getting out of chair: Denies Difficulty using hands for taps, buttons, cutlery, and/or writing: Reports   Review of Systems  Constitutional: Positive for fatigue. Negative for night sweats, weight gain, weight loss and weakness.  HENT: Negative for mouth sores, trouble swallowing, trouble swallowing, mouth dryness and nose dryness.   Eyes: Negative for pain, redness, visual disturbance and dryness.  Respiratory: Negative for cough, shortness of breath and difficulty breathing.   Cardiovascular: Negative for chest pain, palpitations, hypertension, irregular heartbeat and swelling in legs/feet.  Gastrointestinal: Negative for blood in stool, constipation and diarrhea.  Endocrine: Negative for increased urination.  Genitourinary: Negative for vaginal dryness.  Musculoskeletal: Positive for arthralgias, joint pain and morning stiffness. Negative for joint swelling, myalgias, muscle weakness, muscle tenderness and myalgias.  Skin: Negative for color change, rash, hair loss, skin tightness, ulcers and sensitivity to sunlight.  Allergic/Immunologic: Negative for susceptible  to infections.  Neurological: Negative for dizziness, memory loss and night sweats.  Hematological: Negative for swollen glands.  Psychiatric/Behavioral: Negative for depressed mood and sleep disturbance. The patient is not nervous/anxious.     PMFS History:  Patient Active Problem List   Diagnosis Date Noted  . OSA (obstructive sleep apnea) 11/04/2016  . Dyslipidemia 09/26/2016  . History of anxiety 09/26/2016  . History of depression 09/26/2016  . History of migraine headaches 09/26/2016  . History of coronary artery disease 09/26/2016  . Spinal stenosis of lumbar region 04/05/2013    Class: Diagnosis of  . ESSENTIAL HYPERTENSION, BENIGN 01/09/2009  . DIZZINESS 01/09/2009  . DYSLIPIDEMIA 01/05/2009  . ANXIETY 01/05/2009  . DEPRESSION 01/05/2009  . MIGRAINE HEADACHE 01/05/2009  . 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 SPINAL STENOSIS, LUMBAR 01/05/2009  . COLONIC POLYPS, HX OF 01/05/2009  . History of gastroesophageal reflux (GERD) 01/05/2009    Past Medical History:  Diagnosis Date  . Arthritis   . CAD (coronary artery disease)   . GERD (gastroesophageal reflux disease)   . Headache(784.0)   . PONV (postoperative nausea and vomiting)    BP dropprd once  . Sleep apnea    do not use CPAP    Family History  Problem Relation Age of Onset  . Heart disease Father 47  . Cancer Father   . Rheumatologic disease Father   . Cancer Mother   . Heart disease Brother 65  . Heart disease Sister 80  . Rheumatologic disease Paternal Grandmother    Past Surgical History:  Procedure Laterality Date  . ABDOMINAL HYSTERECTOMY    . APPENDECTOMY    . BACK   SURGERY  2004   lumbar  . BREAST BIOPSY Left 06/23/2006  . BREAST BIOPSY Left 04/12/2003  . BREAST EXCISIONAL BIOPSY Left 07/13/2006  . BREAST EXCISIONAL BIOPSY Left 2004  . CHOLECYSTECTOMY    . DIAGNOSTIC LAPAROSCOPY    . ESOPHAGEAL DILATION  03/31/2013  . HARDWARE REMOVAL N/A 04/04/2013   Procedure: HARDWARE REMOVAL;  Surgeon: Jessy Oto, MD;  Location: [place];  Service: Orthopedics;  Laterality: N/A;  . LUMBAR LAMINECTOMY N/A 04/04/2013   Procedure: Left L3-4 lateral recess decompression and Left L3-4 foraminotomy, Removal of Rod and Screws Left L4-5;  Surgeon: Jessy Oto, MD;  Location: [place];  Service: Orthopedics;  Laterality: N/A;  . TONSILLECTOMY Right    nodals removed  . TUBAL LIGATION    . UPPER GI ENDOSCOPY     Social History   Social History Narrative  . No narrative on file     Objective: Vital Signs: BP (!) 170/75 (BP Location: Right Arm, Patient Position: Sitting, Cuff Size: Normal)   Pulse (!) 56   Resp 16   Ht 5' 1" (1.549 m)   Wt 194 lb (88 kg)   BMI 36.66 kg/m    Physical Exam  Constitutional: She is oriented to person, place, and time. She appears well-developed and well-nourished.  HENT:  Head: Normocephalic and atraumatic.  Eyes: Conjunctivae and EOM are normal.  Neck: Normal range of motion.  Cardiovascular: Normal rate, regular rhythm, normal heart sounds and intact distal pulses.   Pulmonary/Chest: Effort normal and breath sounds normal.  Abdominal: Soft. Bowel sounds are normal.  Lymphadenopathy:    She has no cervical adenopathy.  Neurological: She is alert and oriented to person, place, and time.  Skin: Skin is warm and dry. Capillary refill takes less than 2 seconds.  Psychiatric: She has a normal mood and affect. Her behavior is normal.  Nursing note and vitals reviewed.    Musculoskeletal Exam: C-spine and thoracic was good range of motion. Lumbar spine limited painful range of motion. Shoulder joints elbow joints wrist joints good range of motion. She is some tenderness on palpation of her vulvar aspect of her right wrist. No synovitis was noted. She has DIP PIP thickening of bilateral hands but no synovitis was noted. Hip joints knee joints ankles MTPs PIPs with good range of motion.  CDAI Exam: No CDAI exam completed.    Investigation: Findings:  09/04/2016 RF  negative, CCP negative, ESR 1, CRP normal, ANA negative, uric acid 3.3  09/26/2016 HLA-B27 antigen and Pan-ANCA are negative      CBC Latest Ref Rng & Units 09/26/2016 03/31/2013  WBC 3.8 - 10.8 K/uL 6.9 8.8  Hemoglobin 11.7 - 15.5 g/dL 13.7 16.0(H)  Hematocrit 35.0 - 45.0 % 42.3 46.7(H)  Platelets 140 - 400 K/uL 206 238   CMP Latest Ref Rng & Units 09/26/2016 03/31/2013  Glucose 65 - 99 mg/dL 87 94  BUN 7 - 25 mg/dL 19 16  Creatinine 0.60 - 0.93 mg/dL 1.03(H) 0.78  Sodium 135 - 146 mmol/L 141 140  Potassium 3.5 - 5.3 mmol/L 4.4 3.7  Chloride 98 - 110 mmol/L 106 101  CO2 20 - 31 mmol/L 23 27  Calcium 8.6 - 10.4 mg/dL 9.5 9.6  Total Protein 6.1 - 8.1 g/dL 6.4 7.1  Total Bilirubin 0.2 - 1.2 mg/dL 0.4 0.3  Alkaline Phos 33 - 130 U/L 93 102  AST 10 - 35 U/L 23 20  ALT 6 - 29 U/L 20 22  Imaging: [place] Extrem Up Bilat Comp  Result Date: 11/04/2016 Ultrasound examination of bilateral hands was performed per EULAR recommendations. Using 12 MHz transducer, grayscale and power Doppler bilateral second, third, and fifth MCP joints, right second and third PIP and bilateral wrist joints both dorsal and volar aspects were evaluated to look for synovitis or tenosynovitis. The findings were there was no synovitis or tenosynovitis on ultrasound examination. Severe narrowing of PIP joints was noted. Right median nerve was 0.08 cm squares which was within normal limits and left median nerve was 0.08 cm squares which was within normal limits. Impression: Ultrasound examination of bilateral hands was consistent with severe osteoarthritis. No inflammatory arthritis was noted. Bilateral median nerves are within normal limits.  Mm Screening Breast Tomo Bilateral  Result Date: 10/08/2016 CLINICAL DATA:  Screening. EXAM: 2D DIGITAL SCREENING BILATERAL MAMMOGRAM WITH CAD AND ADJUNCT TOMO COMPARISON:  Previous exam(s). ACR Breast Density Category c: The breast tissue is heterogeneously dense, which may obscure small  masses. FINDINGS: There are no findings suspicious for malignancy. Images were processed with CAD. IMPRESSION: No mammographic evidence of malignancy. A result letter of this screening mammogram will be mailed directly to the patient. RECOMMENDATION: Screening mammogram in one year. (Code:SM-B-01Y) BI-RADS CATEGORY  1: Negative. Electronically Signed   By: Trude Mcburney M.D.   On: 10/08/2016 10:52    Speciality Comments: No specialty comments available.    Procedures:  No procedures performed Allergies: Sulfamethoxazole-trimethoprim; Statins; Duloxetine; Other; Oxycontin [oxycodone hcl]; Pregabalin; Erythromycin; Penicillins; and Phenobarbital   Assessment / Plan:     Visit Diagnoses:   Primary osteoarthritis of right hand clinical findings, radiographic findings and ultrasound findings are consistent with osteoarthritis of the hand.  She was complaining of pain in bilateral hands which were both negative for synovitis. Detailed counseling regarding osteoarthritis was provided. Joint protection and muscle strength was discussed. A handout on hand muscle strengthening exercises was given. And a handout on natural supplements was given. Patient will continue to use Voltaren gel.  Rash: Resolved  H/O oral aphthous ulcers  Spinal stenosis of lumbar region without neurogenic claudication - Status post surgery 3 by Dr. Louanne Skye. Chronic pain  Her other medical problems are listed as follows  COLONIC POLYPS, HX OF  History of gastroesophageal reflux (GERD)  History of anxiety  History of depression  History of migraine headaches  History of coronary artery disease  History of hyperlipidemia  Pain in both hands - Plan: [place] Extrem Up Bilat Comp    Orders: Orders Placed This Encounter  Procedures  . [place] Extrem Up Bilat Comp   No orders of the defined types were placed in this encounter.   Face-to-face time spent with patient was 30 minutes. 50% of time was spent in counseling  and coordination of care.  Follow-Up Instructions: Return if symptoms worsen or fail to improve, for Osteoarthritis.   Bo Merino, MD  Note - This record has been created using Editor, commissioning.  Chart creation errors have been sought, but may not always  have been located. Such creation errors do not reflect on  the standard of medical care.

## 2016-11-04 ENCOUNTER — Inpatient Hospital Stay (INDEPENDENT_AMBULATORY_CARE_PROVIDER_SITE_OTHER): Payer: Self-pay

## 2016-11-04 ENCOUNTER — Encounter: Payer: Self-pay | Admitting: Rheumatology

## 2016-11-04 ENCOUNTER — Ambulatory Visit (INDEPENDENT_AMBULATORY_CARE_PROVIDER_SITE_OTHER): Payer: Medicare Other | Admitting: Adult Health

## 2016-11-04 ENCOUNTER — Encounter: Payer: Self-pay | Admitting: Adult Health

## 2016-11-04 ENCOUNTER — Ambulatory Visit (INDEPENDENT_AMBULATORY_CARE_PROVIDER_SITE_OTHER): Payer: Medicare Other | Admitting: Rheumatology

## 2016-11-04 VITALS — BP 170/75 | HR 56 | Resp 16 | Ht 61.0 in | Wt 194.0 lb

## 2016-11-04 DIAGNOSIS — M48061 Spinal stenosis, lumbar region without neurogenic claudication: Secondary | ICD-10-CM | POA: Diagnosis not present

## 2016-11-04 DIAGNOSIS — Z8669 Personal history of other diseases of the nervous system and sense organs: Secondary | ICD-10-CM | POA: Diagnosis not present

## 2016-11-04 DIAGNOSIS — M79641 Pain in right hand: Secondary | ICD-10-CM

## 2016-11-04 DIAGNOSIS — Z86010 Personal history of colonic polyps: Secondary | ICD-10-CM

## 2016-11-04 DIAGNOSIS — M19041 Primary osteoarthritis, right hand: Secondary | ICD-10-CM

## 2016-11-04 DIAGNOSIS — M79642 Pain in left hand: Secondary | ICD-10-CM | POA: Diagnosis not present

## 2016-11-04 DIAGNOSIS — I2510 Atherosclerotic heart disease of native coronary artery without angina pectoris: Secondary | ICD-10-CM | POA: Diagnosis not present

## 2016-11-04 DIAGNOSIS — R21 Rash and other nonspecific skin eruption: Secondary | ICD-10-CM | POA: Diagnosis not present

## 2016-11-04 DIAGNOSIS — G4733 Obstructive sleep apnea (adult) (pediatric): Secondary | ICD-10-CM | POA: Diagnosis not present

## 2016-11-04 DIAGNOSIS — Z8659 Personal history of other mental and behavioral disorders: Secondary | ICD-10-CM

## 2016-11-04 DIAGNOSIS — Z8639 Personal history of other endocrine, nutritional and metabolic disease: Secondary | ICD-10-CM | POA: Diagnosis not present

## 2016-11-04 DIAGNOSIS — Z8679 Personal history of other diseases of the circulatory system: Secondary | ICD-10-CM | POA: Diagnosis not present

## 2016-11-04 DIAGNOSIS — Z8719 Personal history of other diseases of the digestive system: Secondary | ICD-10-CM

## 2016-11-04 NOTE — Patient Instructions (Signed)
Hand Exercises Hand exercises can be helpful to almost anyone. These exercises can strengthen the hands, improve flexibility and movement, and increase blood flow to the hands. These results can make work and daily tasks easier. Hand exercises can be especially helpful for people who have joint pain from arthritis or have nerve damage from overuse (carpal tunnel syndrome). These exercises can also help people who have injured a hand. Most of these hand exercises are fairly gentle stretching routines. You can do them often throughout the day. Still, it is a good idea to ask your health care provider which exercises would be best for you. Warming your hands before exercise may help to reduce stiffness. You can do this with gentle massage or by placing your hands in warm water for 15 minutes. Also, make sure you pay attention to your level of hand pain as you begin an exercise routine. Exercises Knuckle Bend Repeat this exercise 5-10 times with each hand. 1. Stand or sit with your arm, hand, and all five fingers pointed straight up. Make sure your wrist is straight. 2. Gently and slowly bend your fingers down and inward until the tips of your fingers are touching the tops of your palm. 3. Hold this position for a few seconds. 4. Extend your fingers out to their original position, all pointing straight up again.  Finger Fan Repeat this exercise 5-10 times with each hand. 1. Hold your arm and hand out in front of you. Keep your wrist straight. 2. Squeeze your hand into a fist. 3. Hold this position for a few seconds. 4. Fan out, or spread apart, your hand and fingers as much as possible, stretching every joint fully.  Tabletop Repeat this exercise 5-10 times with each hand. 1. Stand or sit with your arm, hand, and all five fingers pointed straight up. Make sure your wrist is straight. 2. Gently and slowly bend your fingers at the knuckles where they meet the hand until your hand is making an  upside-down L shape. Your fingers should form a tabletop. 3. Hold this position for a few seconds. 4. Extend your fingers out to their original position, all pointing straight up again.  Making Os Repeat this exercise 5-10 times with each hand. 1. Stand or sit with your arm, hand, and all five fingers pointed straight up. Make sure your wrist is straight. 2. Make an O shape by touching your pointer finger to your thumb. Hold for a few seconds. Then open your hand wide. 3. Repeat this motion with each finger on your hand.  Table Spread Repeat this exercise 5-10 times with each hand. 1. Place your hand on a table with your palm facing down. Make sure your wrist is straight. 2. Spread your fingers out as much as possible. Hold this position for a few seconds. 3. Slide your fingers back together again. Hold for a few seconds.  Ball Grip  Repeat this exercise 10-15 times with each hand. 1. Hold a tennis ball or another soft ball in your hand. 2. While slowly increasing pressure, squeeze the ball as hard as possible. 3. Squeeze as hard as you can for 3-5 seconds. 4. Relax and repeat.  Wrist Curls Repeat this exercise 10-15 times with each hand. 1. Sit in a chair that has armrests. 2. Hold a light weight in your hand, such as a dumbbell that weighs 1-3 pounds (0.5-1.4 kg). Ask your health care provider what weight would be best for you. 3. Rest your hand just over   just over the end of the chair arm with your palm facing up. 4. Gently pivot your wrist up and down while holding the weight. Do not twist your wrist from side to side.  Contact a health care provider if:  Your hand pain or discomfort gets much worse when you do an exercise.  Your hand pain or discomfort does not improve within 2 hours after you exercise. If you have any of these problems, stop doing these exercises right away. Do not do them again unless your health care provider says that you can. Get help right away if:  You  develop sudden, severe hand pain. If this happens, stop doing these exercises right away. Do not do them again unless your health care provider says that you can. This information is not intended to replace advice given to you by your health care provider. Make sure you discuss any questions you have with your health care provider. Document Released: 04/23/2015 Document Revised: 10/18/2015 Document Reviewed: 11/20/2014 Elsevier Interactive Patient Education  2018 Elsevier Inc. Natural anti-inflammatories  You can purchase these at [employer], [employer] or online.  . Turmeric (capsules)  . Ginger (ginger root or capsules)  . Omega 3 (Fish, flax seeds, chia seeds, walnuts, almonds)  . Tart cherry (dried or extract)   Patient should be under the care of a physician while taking these supplements. This may not be reproduced without the permission of Dr. Quinnetta Roepke.  

## 2016-11-04 NOTE — Addendum Note (Signed)
Addended by: Parke Poisson E on: 11/04/2016 11:59 AM   Modules accepted: Orders

## 2016-11-04 NOTE — Progress Notes (Signed)
@Patient  ID: Jessica Gould, female    DOB: 1945-06-22, 71 y.o.   MRN: [id number]  Chief Complaint  Patient presents with  . Follow-up    OSA    Referring provider: Rochel Brome, MD  HPI: 71 year old female seen for sleep consult September 2016 found to have OSA   TEST  HST 03/05/15 >> AHI 18.3, SaO2 low 77%  11/04/2016 Follow up ; OSA  Patient presents for a follow-up for sleep apnea. Patient was last seen in September 2016. Patient was seen for sleep consult in September 2016 found to have moderate sleep apnea on home sleep test. AHI was 18.3, SaO2 low at 10 December 7. Patient was started on C Pap but says she did not get supplies [employer] company would not cover. She wants to try again and see if she can have different mask (full face mask puts too much pressure  On eyes/nose.).  She has significant daytime sleepiness and restless sleep.   Allergies  Allergen Reactions  . Sulfamethoxazole-Trimethoprim Swelling, Anaphylaxis and Rash    lips lips  . Statins Other (See Comments)    Other reaction(s): Other (See Comments) Body aches Nausea Body aches Nausea Body aches Nausea  . Duloxetine     Cymbalta= unknown Cymbalta= unknown  . Other     "Cat Gut Sutures"= abscess  . Oxycontin [Oxycodone Hcl]     migraine headache  . Pregabalin     Lyrica= unknown  . Erythromycin Nausea And Vomiting    Abdominal pain Abdominal pain  . Penicillins Itching and Rash  . Phenobarbital Rash    Immunization History  Administered Date(s) Administered  . Influenza, High Dose Seasonal PF 02/24/2016  . Pneumococcal Polysaccharide-23 02/24/2016    Past Medical History:  Diagnosis Date  . Arthritis   . CAD (coronary artery disease)   . GERD (gastroesophageal reflux disease)   . Headache(784.0)   . PONV (postoperative nausea and vomiting)    BP dropprd once  . Sleep apnea    do not use CPAP    Tobacco History: History  Smoking Status  . Never Smoker  Smokeless  Tobacco  . Never Used   Counseling given: Not Answered   Outpatient Encounter Prescriptions as of 11/04/2016  Medication Sig  . aspirin EC 81 MG tablet Take 1 tablet (81 mg total) by mouth daily.  . clopidogrel (PLAVIX) 75 MG tablet Take 1 tablet (75 mg total) by mouth daily.  Marland Kitchen HYDROcodone-acetaminophen (NORCO/VICODIN) 5-325 MG tablet Take 1 tablet by mouth every 6 (six) hours as needed for moderate pain.  . metoprolol succinate (TOPROL-XL) 100 MG 24 hr tablet Take 1 tablet (100 mg total) by mouth daily.  . naproxen (NAPROSYN) 125 MG/5ML suspension Take 10 mLs (250 mg total) by mouth 2 (two) times daily as needed.  . nitroGLYCERIN (NITROSTAT) 0.4 MG SL tablet Place 1 tablet (0.4 mg total) under the tongue every 5 (five) minutes as needed for chest pain.  . pantoprazole (PROTONIX) 40 MG tablet Take 40 mg by mouth.  . sertraline (ZOLOFT) 50 MG tablet Take 1 tablet (50 mg total) by mouth daily.  . rosuvastatin (CRESTOR) 20 MG tablet Take 1 tablet (20 mg total) by mouth daily.  . [DISCONTINUED] magic mouthwash w/lidocaine SOLN Take 5 mLs by mouth 4 (four) times daily. (Patient not taking: Reported on 11/04/2016)   No facility-administered encounter medications on file as of 11/04/2016.      Review of Systems  Constitutional:   No  weight loss, night sweats,  Fevers, chills, fatigue, or  lassitude.  HEENT:   No headaches,  Difficulty swallowing,  Tooth/dental problems, or  Sore throat,                No sneezing, itching, ear ache, nasal congestion, post nasal drip,   CV:  No chest pain,  Orthopnea, PND, swelling in lower extremities, anasarca, dizziness, palpitations, syncope.   GI  No heartburn, indigestion, abdominal pain, nausea, vomiting, diarrhea, change in bowel habits, loss of appetite, bloody stools.   Resp: No shortness of breath with exertion or at rest.  No excess mucus, no productive cough,  No non-productive cough,  No coughing up of blood.  No change in color of mucus.  No  wheezing.  No chest wall deformity  Skin: no rash or lesions.  GU: no dysuria, change in color of urine, no urgency or frequency.  No flank pain, no hematuria   MS:  No joint pain or swelling.  No decreased range of motion.  No back pain.    Physical Exam  BP 108/64 (BP Location: Left Arm, Cuff Size: Normal)   Pulse 60   Ht 5\' 1"  (1.549 m)   Wt 192 lb 9.6 oz (87.4 kg)   SpO2 96%   BMI 36.39 kg/m   GEN: A/Ox3; pleasant , NAD, obese   HEENT:  [place]/AT,  EACs-clear, TMs-wnl, NOSE-clear, THROAT-clear, no lesions, no postnasal drip or exudate noted. Class II MP airway  NECK:  Supple w/ fair ROM; no JVD; normal carotid impulses w/o bruits; no thyromegaly or nodules palpated; no lymphadenopathy.    RESP  Clear  P & A; w/o, wheezes/ rales/ or rhonchi. no accessory muscle use, no dullness to percussion  CARD:  RRR, no m/r/g, no peripheral edema, pulses intact, no cyanosis or clubbing.  GI:   Soft & nt; nml bowel sounds; no organomegaly or masses detected.   Musco: Warm bil, no deformities or joint swelling noted.   Neuro: alert, no focal deficits noted.    Skin: Warm, no lesions or rashes    Lab Results:  CBC    Component Value Date/Time   WBC 6.9 09/26/2016 1221   RBC 4.58 09/26/2016 1221   HGB 13.7 09/26/2016 1221   HCT 42.3 09/26/2016 1221   PLT 206 09/26/2016 1221   MCV 92.4 09/26/2016 1221   MCH 29.9 09/26/2016 1221   MCHC 32.4 09/26/2016 1221   RDW 14.2 09/26/2016 1221   LYMPHSABS 1,449 09/26/2016 1221   MONOABS 552 09/26/2016 1221   EOSABS 276 09/26/2016 1221   BASOSABS 0 09/26/2016 1221    BMET    Component Value Date/Time   NA 141 09/26/2016 1221   K 4.4 09/26/2016 1221   CL 106 09/26/2016 1221   CO2 23 09/26/2016 1221   GLUCOSE 87 09/26/2016 1221   BUN 19 09/26/2016 1221   CREATININE 1.03 (H) 09/26/2016 1221   CALCIUM 9.5 09/26/2016 1221   GFRNONAA 54 (L) 09/26/2016 1221   GFRAA 63 09/26/2016 1221    BNP No results found for: BNP  ProBNP No  results found for: PROBNP  Imaging: Mm Screening Breast Tomo Bilateral  Result Date: 10/08/2016 CLINICAL DATA:  Screening. EXAM: 2D DIGITAL SCREENING BILATERAL MAMMOGRAM WITH CAD AND ADJUNCT TOMO COMPARISON:  Previous exam(s). ACR Breast Density Category c: The breast tissue is heterogeneously dense, which may obscure small masses. FINDINGS: There are no findings suspicious for malignancy. Images were processed with CAD. IMPRESSION: No mammographic evidence of  malignancy. A result letter of this screening mammogram will be mailed directly to the patient. RECOMMENDATION: Screening mammogram in one year. (Code:SM-B-01Y) BI-RADS CATEGORY  1: Negative. Electronically Signed   By: Trude Mcburney M.D.   On: 10/08/2016 10:52     Assessment & Plan:   OSA (obstructive sleep apnea) Moderate OSA Needs to restart C Pap. We'll set up mask fitting an order for new supplies.  Plan  Patient Instructions  Restart C Pap at bedtime Order sent for new supplies and mask fitting. Wear for at least 4-6 hours each night Work on weight loss. Do not drive a sleepy Follow-up in 2 months with Dr.Sood or Dimitra Woodstock NP        Rexene Edison, NP 11/04/2016

## 2016-11-04 NOTE — Patient Instructions (Signed)
Restart C Pap at bedtime Order sent for new supplies and mask fitting. Wear for at least 4-6 hours each night Work on weight loss. Do not drive a sleepy Follow-up in 2 months with Dr.Sood or Deshay Kirstein NP

## 2016-11-04 NOTE — Assessment & Plan Note (Addendum)
Moderate OSA Needs to restart C Pap. We'll set up mask fitting an order for new supplies.  Plan  Patient Instructions  Restart C Pap at bedtime Order sent for new supplies and mask fitting. Wear for at least 4-6 hours each night Work on weight loss. Do not drive a sleepy Follow-up in 2 months with Dr.Sood or Lucas Exline NP

## 2016-11-18 ENCOUNTER — Other Ambulatory Visit (HOSPITAL_BASED_OUTPATIENT_CLINIC_OR_DEPARTMENT_OTHER): Payer: Medicare Other

## 2016-11-19 ENCOUNTER — Ambulatory Visit: Payer: Medicare Other | Admitting: Rheumatology

## 2016-11-24 ENCOUNTER — Ambulatory Visit (HOSPITAL_BASED_OUTPATIENT_CLINIC_OR_DEPARTMENT_OTHER): Payer: Medicare Other | Attending: Adult Health | Admitting: Radiology

## 2016-11-24 DIAGNOSIS — G4733 Obstructive sleep apnea (adult) (pediatric): Secondary | ICD-10-CM

## 2016-11-27 ENCOUNTER — Telehealth: Payer: Self-pay | Admitting: Adult Health

## 2016-11-27 NOTE — Telephone Encounter (Signed)
lmom tcb x1 to pt, please inform pt we will look into this and then follow up   Spoke with Tiffany at [facility] who states she was the one who spoke with the pt and that they have the order for the pt but they are concerned because pt had her mask fitting at the hospital and they are not understanding why when they do mask fitting at their office. They are concerned that they will have some kick back from insurance. Explained to [facility] that pt just received a mask, but her supplies and machine would come from them. Tiffany states they again are still concerned for the purpose of insurance.   Spoke to [place] who stated to forward the message to the [facility] and they will look into this.

## 2016-11-27 NOTE — Telephone Encounter (Signed)
Pt states that she does not need anything further. Will let us know if any issues with the CPAP. Nothing further needed.

## 2016-11-27 NOTE — Telephone Encounter (Signed)
Patient called back and spoke to [place] at [facility] and he has sent her up for an appointment at [facility] on 12/02/16 to set up her machine - pt can be reached at [phone] -pr

## 2017-01-07 ENCOUNTER — Ambulatory Visit (INDEPENDENT_AMBULATORY_CARE_PROVIDER_SITE_OTHER): Payer: Medicare Other | Admitting: Adult Health

## 2017-01-07 ENCOUNTER — Encounter: Payer: Self-pay | Admitting: Adult Health

## 2017-01-07 DIAGNOSIS — I2510 Atherosclerotic heart disease of native coronary artery without angina pectoris: Secondary | ICD-10-CM

## 2017-01-07 DIAGNOSIS — G4733 Obstructive sleep apnea (adult) (pediatric): Secondary | ICD-10-CM | POA: Diagnosis not present

## 2017-01-07 NOTE — Addendum Note (Signed)
Addended by: Parke Poisson E on: 01/07/2017 12:42 PM   Modules accepted: Orders

## 2017-01-07 NOTE — Patient Instructions (Signed)
Continue on C Pap at bedtime Keep up good work.  Order sent for new CPAP machine .  Wear for at least 4-6 hours each night Work on weight loss. Do not drive a sleepy Follow-up in 6 months with Dr.Sood or Ashleah Valtierra NP

## 2017-01-07 NOTE — Assessment & Plan Note (Signed)
Well controlled   Plan  Patient Instructions  Continue on C Pap at bedtime Keep up good work.  Order sent for new CPAP machine .  Wear for at least 4-6 hours each night Work on weight loss. Do not drive a sleepy Follow-up in 6 months with Dr.Sood or Roselani Grajeda NP

## 2017-01-07 NOTE — Assessment & Plan Note (Signed)
Wt loss  

## 2017-01-07 NOTE — Progress Notes (Signed)
@Patient  ID: Jessica Gould, female    DOB: [birth year], 71 y.o.   MRN: [id number]  Chief Complaint  Patient presents with  . Follow-up    OSA     Referring provider: Rochel Brome, MD  HPI: 71 year old female seen for sleep consult September 2016 found to have OSA   TEST  HST 03/05/15 >> AHI 18.3, SaO2 low 77%  01/07/2017 Follow up : OSA  Patient returns for follow-up for sleep apnea.Doing so much better. Mask fitting really helped.  She is wearing her C Pap everything on night. She feels rested. No significant daytime sleepiness Download shows excellent compliance with 100% usage. Average usage is 8.5 hours. AHI is 4. Patient says her machine is getting old. Request a new machine.  Allergies  Allergen Reactions  . Sulfamethoxazole-Trimethoprim Swelling, Anaphylaxis and Rash    lips lips  . Statins Other (See Comments)    Other reaction(s): Other (See Comments) Body aches Nausea Body aches Nausea Body aches Nausea  . Duloxetine     Cymbalta= unknown Cymbalta= unknown  . Other     "Cat Gut Sutures"= abscess  . Oxycontin [Oxycodone Hcl]     migraine headache  . Pregabalin     Lyrica= unknown  . Erythromycin Nausea And Vomiting    Abdominal pain Abdominal pain  . Penicillins Itching and Rash  . Phenobarbital Rash    Immunization History  Administered Date(s) Administered  . Influenza, High Dose Seasonal PF 02/24/2016  . Pneumococcal Polysaccharide-23 02/24/2016    Past Medical History:  Diagnosis Date  . Arthritis   . CAD (coronary artery disease)   . GERD (gastroesophageal reflux disease)   . Headache(784.0)   . PONV (postoperative nausea and vomiting)    BP dropprd once  . Sleep apnea    do not use CPAP    Tobacco History: History  Smoking Status  . Never Smoker  Smokeless Tobacco  . Never Used   Counseling given: Not Answered   Outpatient Encounter Prescriptions as of 01/07/2017  Medication Sig  . aspirin EC 81 MG tablet Take 1  tablet (81 mg total) by mouth daily.  . clopidogrel (PLAVIX) 75 MG tablet Take 1 tablet (75 mg total) by mouth daily.  Marland Kitchen HYDROcodone-acetaminophen (NORCO/VICODIN) 5-325 MG tablet Take 1 tablet by mouth every 6 (six) hours as needed for moderate pain.  . metoprolol succinate (TOPROL-XL) 100 MG 24 hr tablet Take 1 tablet (100 mg total) by mouth daily.  . nitroGLYCERIN (NITROSTAT) 0.4 MG SL tablet Place 1 tablet (0.4 mg total) under the tongue every 5 (five) minutes as needed for chest pain.  . pantoprazole (PROTONIX) 40 MG tablet Take 40 mg by mouth.  . rosuvastatin (CRESTOR) 20 MG tablet Take 1 tablet (20 mg total) by mouth daily.  . sertraline (ZOLOFT) 50 MG tablet Take 1 tablet (50 mg total) by mouth daily.  . rosuvastatin (CRESTOR) 20 MG tablet Take 1 tablet (20 mg total) by mouth daily.  . [DISCONTINUED] naproxen (NAPROSYN) 125 MG/5ML suspension Take 10 mLs (250 mg total) by mouth 2 (two) times daily as needed.   No facility-administered encounter medications on file as of 01/07/2017.      Review of Systems  Constitutional:   No  weight loss, night sweats,  Fevers, chills, fatigue, or  lassitude.  HEENT:   No headaches,  Difficulty swallowing,  Tooth/dental problems, or  Sore throat,  No sneezing, itching, ear ache, nasal congestion, post nasal drip,   CV:  No chest pain,  Orthopnea, PND, swelling in lower extremities, anasarca, dizziness, palpitations, syncope.   GI  No heartburn, indigestion, abdominal pain, nausea, vomiting, diarrhea, change in bowel habits, loss of appetite, bloody stools.   Resp: No shortness of breath with exertion or at rest.  No excess mucus, no productive cough,  No non-productive cough,  No coughing up of blood.  No change in color of mucus.  No wheezing.  No chest wall deformity  Skin: no rash or lesions.  GU: no dysuria, change in color of urine, no urgency or frequency.  No flank pain, no hematuria   MS:  No joint pain or swelling.  No  decreased range of motion.  No back pain.    Physical Exam  BP (!) 150/80 (BP Location: Right Arm, Patient Position: Sitting, Cuff Size: Large)   Pulse 60   Ht 5\' 1"  (1.549 m)   Wt 202 lb 6.4 oz (91.8 kg)   SpO2 97%   BMI 38.24 kg/m   GEN: A/Ox3; pleasant , NAD, well nourished    HEENT:  /AT,  EACs-clear, TMs-wnl, NOSE-clear, THROAT-clear, no lesions, no postnasal drip or exudate noted.  Class 2 MP airway   NECK:  Supple w/ fair ROM; no JVD; normal carotid impulses w/o bruits; no thyromegaly or nodules palpated; no lymphadenopathy.    RESP  Clear  P & A; w/o, wheezes/ rales/ or rhonchi. no accessory muscle use, no dullness to percussion  CARD:  RRR, no m/r/g, no peripheral edema, pulses intact, no cyanosis or clubbing.  GI:   Soft & nt; nml bowel sounds; no organomegaly or masses detected.   Musco: Warm bil, no deformities or joint swelling noted.   Neuro: alert, no focal deficits noted.    Skin: Warm, no lesions or rashes    Lab Results:  CBC  BMET  BNP No results found for: BNP  ProBNP No results found for: PROBNP  Imaging: No results found.   Assessment & Plan:   OSA (obstructive sleep apnea) Well controlled   Plan  Patient Instructions  Continue on C Pap at bedtime Keep up good work.  Order sent for new CPAP machine .  Wear for at least 4-6 hours each night Work on weight loss. Do not drive a sleepy Follow-up in 6 months with Dr.Sood or Jazman Reuter NP     Morbid obesity ([place]) Wt loss      Rexene Edison, NP 01/07/2017

## 2017-01-11 NOTE — Progress Notes (Signed)
I have reviewed and agree with assessment/plan.  Milanya Sunderland, MD [facility] 01/11/2017, 11:03 AM Pager:  [phone]  

## 2017-01-12 NOTE — Progress Notes (Signed)
Cardiology Office Note   Date:  01/14/2017   ID:  Jessica Gould, DOB 09-12-1945, MRN [id number] PCP:  Rochel Brome, MD  Cardiologist:   Minus Breeding, MD   Chief Complaint  Patient presents with  . Coronary Artery Disease      History of Present Illness: Jessica Gould is a 71 y.o. female who presents for evaluation after PCI. She was found to have 30% LAD stenosis. The circumflex had 95-99% proximal stenosis. The right coronary artery 30% stenosis. The EF 70%. She had DES stenting. She did have a small groin hematoma complicating this. She had esophageal pain following this and had to go back to the hospital. She has a problem with esophageal dysmotility.   She is planning to have an EGD soon.   Since I last saw her she has done well.  The patient denies any new symptoms such as chest discomfort, neck or arm discomfort. There has been no new shortness of breath, PND or orthopnea. There have been no reported palpitations, presyncope or syncope.  She is not exercising however.     Past Medical History:  Diagnosis Date  . Arthritis   . CAD (coronary artery disease)   . GERD (gastroesophageal reflux disease)   . Headache(784.0)   . PONV (postoperative nausea and vomiting)    BP dropprd once  . Sleep apnea    On CPAP    Past Surgical History:  Procedure Laterality Date  . ABDOMINAL HYSTERECTOMY    . APPENDECTOMY    . BACK SURGERY  2004   lumbar  . BREAST BIOPSY Left 06/23/2006  . BREAST BIOPSY Left 04/12/2003  . BREAST EXCISIONAL BIOPSY Left 07/13/2006  . BREAST EXCISIONAL BIOPSY Left 2004  . CHOLECYSTECTOMY    . DIAGNOSTIC LAPAROSCOPY    . ESOPHAGEAL DILATION  03/31/2013  . HARDWARE REMOVAL N/A 04/04/2013   Procedure: HARDWARE REMOVAL;  Surgeon: Jessy Oto, MD;  Location: [facility];  Service: Orthopedics;  Laterality: N/A;  . LUMBAR LAMINECTOMY N/A 04/04/2013   Procedure: Left L3-4 lateral recess decompression and Left L3-4 foraminotomy, Removal of Rod and Screws  Left L4-5;  Surgeon: Jessy Oto, MD;  Location: [facility];  Service: Orthopedics;  Laterality: N/A;  . TONSILLECTOMY Right    nodals removed  . TUBAL LIGATION    . UPPER GI ENDOSCOPY       Current Outpatient Prescriptions  Medication Sig Dispense Refill  . aspirin EC 81 MG tablet Take 1 tablet (81 mg total) by mouth daily. 90 tablet 3  . HYDROcodone-acetaminophen (NORCO/VICODIN) 5-325 MG tablet Take 1 tablet by mouth every 6 (six) hours as needed for moderate pain. 60 tablet 0  . metoprolol succinate (TOPROL-XL) 100 MG 24 hr tablet Take 1 tablet (100 mg total) by mouth daily. 90 tablet 3  . nitroGLYCERIN (NITROSTAT) 0.4 MG SL tablet Place 1 tablet (0.4 mg total) under the tongue every 5 (five) minutes as needed for chest pain. 25 tablet 0  . pantoprazole (PROTONIX) 40 MG tablet Take 40 mg by mouth daily.     . rosuvastatin (CRESTOR) 20 MG tablet Take 1 tablet (20 mg total) by mouth daily. 30 tablet 11  . sertraline (ZOLOFT) 50 MG tablet Take 1 tablet (50 mg total) by mouth daily.    Marland Kitchen amLODipine (NORVASC) 2.5 MG tablet Take 1 tablet (2.5 mg total) by mouth daily. 90 tablet 3   No current facility-administered medications for this visit.     Allergies:  Sulfamethoxazole-trimethoprim; Statins; Duloxetine; Other; Oxycontin [oxycodone hcl]; Pregabalin; Erythromycin; Penicillins; and Phenobarbital    ROS:  Please see the history of present illness.   Otherwise, review of systems are positive for none.   All other systems are reviewed and negative.    PHYSICAL EXAM: VS:  BP (!) 142/78   Pulse (!) 58   Ht 5\' 1"  (1.549 m)   Wt 200 lb 3.2 oz (90.8 kg)   BMI 37.83 kg/m  , BMI Body mass index is 37.83 kg/m.  GENERAL:  Well appearing NECK:  No jugular venous distention, waveform within normal limits, carotid upstroke brisk and symmetric, no bruits, no thyromegaly LUNGS:  Clear to auscultation bilaterally CHEST:  Unremarkable HEART:  PMI not displaced or sustained,S1 and S2 within normal  limits, no S3, no S4, no clicks, no rubs, no murmurs ABD:  Flat, positive bowel sounds normal in frequency in pitch, no bruits, no rebound, no guarding, no midline pulsatile mass, no hepatomegaly, no splenomegaly EXT:  2 plus pulses throughout, no edema, no cyanosis no clubbing   GENERAL:  Well appearing NECK:  No jugular venous distention, waveform within normal limits, carotid upstroke brisk and symmetric, no bruits, no thyromegaly LUNGS:  Clear to auscultation bilaterally BACK:  No CVA tenderness HEART:  PMI not displaced or sustained,S1 and S2 within normal limits, no S3, no S4, no clicks, no rubs, no murmurs ABD:  Flat, positive bowel sounds normal in frequency in pitch, no bruits, no rebound, no guarding, no midline pulsatile mass, no hepatomegaly, no splenomegaly EXT:  2 plus pulses throughout, no edema, no cyanosis no clubbing    EKG:  EKG is  ordered today. Sinus rhythm, rate 58, axis within normal limits, intervals normal limits, no acute ST-T wave changes.  Recent Labs: 09/26/2016: ALT 20; BUN 19; Creat 1.03; Hemoglobin 13.7; Platelets 206; Potassium 4.4; Sodium 141    Lipid Panel No results found for: CHOL, TRIG, HDL, CHOLHDL, VLDL, LDLCALC, LDLDIRECT    Wt Readings from Last 3 Encounters:  01/13/17 200 lb 3.2 oz (90.8 kg)  01/07/17 202 lb 6.4 oz (91.8 kg)  11/04/16 194 lb (88 kg)      Other studies Reviewed: Additional studies/ records that were reviewed today include: None Review of the above records demonstrates:      ASSESSMENT AND PLAN:  CAD:  The patient is doing  well. She needs better lifestyle changes with diet and exercise and we talked about this.  She can stop her Plavix.      DYSLIPIDEMIA:  She's tolerating her current statins and her LDL was 75 with HDL of 42.  She will continue the meds as listed.   HTN:  The blood pressure is not at target.  I will add Norvasc 2.5 mg daily.   Current medicines are reviewed at length with the patient today.  The  patient does not have concerns regarding medicines.  The following changes have been made:  As above  Labs/ tests ordered today include: None  No orders of the defined types were placed in this encounter.    Disposition:   FU with me in  12 months    Signed, Minus Breeding, MD  01/14/2017 9:12 AM    [place]

## 2017-01-13 ENCOUNTER — Encounter: Payer: Self-pay | Admitting: Cardiology

## 2017-01-13 ENCOUNTER — Ambulatory Visit (INDEPENDENT_AMBULATORY_CARE_PROVIDER_SITE_OTHER): Payer: Medicare Other | Admitting: Cardiology

## 2017-01-13 VITALS — BP 142/78 | HR 58 | Ht 61.0 in | Wt 200.2 lb

## 2017-01-13 DIAGNOSIS — I2510 Atherosclerotic heart disease of native coronary artery without angina pectoris: Secondary | ICD-10-CM

## 2017-01-13 DIAGNOSIS — E785 Hyperlipidemia, unspecified: Secondary | ICD-10-CM

## 2017-01-13 DIAGNOSIS — I10 Essential (primary) hypertension: Secondary | ICD-10-CM | POA: Diagnosis not present

## 2017-01-13 MED ORDER — AMLODIPINE BESYLATE 2.5 MG PO TABS
2.5000 mg | ORAL_TABLET | Freq: Every day | ORAL | 3 refills | Status: DC
Start: 2017-01-13 — End: 2018-01-08

## 2017-01-13 NOTE — Patient Instructions (Signed)
Medication Instructions:  START- Amlodipine 2.5 mg daily STOP- Plavix  If you need a refill on your cardiac medications before your next appointment, please call your pharmacy.  Labwork: None Ordered HERE IN OUR OFFICE AT [facility]  Testing/Procedures: None ordered  Follow-Up: Your physician wants you to follow-up in: 1 Year. You should receive a reminder letter in the mail two months in advance. If you do not receive a letter, please call our office [phone].     Thank you for choosing [facility]!!

## 2017-01-14 ENCOUNTER — Encounter: Payer: Self-pay | Admitting: Cardiology

## 2017-01-14 ENCOUNTER — Ambulatory Visit (INDEPENDENT_AMBULATORY_CARE_PROVIDER_SITE_OTHER): Payer: Medicare Other | Admitting: Specialist

## 2017-01-15 NOTE — Addendum Note (Signed)
Addended by: Vennie Homans on: 01/15/2017 08:13 AM   Modules accepted: Orders

## 2017-01-30 DIAGNOSIS — I2510 Atherosclerotic heart disease of native coronary artery without angina pectoris: Secondary | ICD-10-CM | POA: Diagnosis not present

## 2017-01-30 DIAGNOSIS — R079 Chest pain, unspecified: Secondary | ICD-10-CM | POA: Diagnosis not present

## 2017-01-30 DIAGNOSIS — I10 Essential (primary) hypertension: Secondary | ICD-10-CM | POA: Diagnosis not present

## 2017-01-30 DIAGNOSIS — K220 Achalasia of cardia: Secondary | ICD-10-CM | POA: Diagnosis not present

## 2017-01-30 DIAGNOSIS — K219 Gastro-esophageal reflux disease without esophagitis: Secondary | ICD-10-CM | POA: Diagnosis not present

## 2017-01-30 DIAGNOSIS — Z9049 Acquired absence of other specified parts of digestive tract: Secondary | ICD-10-CM | POA: Diagnosis not present

## 2017-01-30 DIAGNOSIS — R1310 Dysphagia, unspecified: Secondary | ICD-10-CM | POA: Diagnosis not present

## 2017-01-30 DIAGNOSIS — F419 Anxiety disorder, unspecified: Secondary | ICD-10-CM | POA: Diagnosis not present

## 2017-01-30 DIAGNOSIS — K869 Disease of pancreas, unspecified: Secondary | ICD-10-CM | POA: Diagnosis not present

## 2017-01-30 DIAGNOSIS — Z8673 Personal history of transient ischemic attack (TIA), and cerebral infarction without residual deficits: Secondary | ICD-10-CM | POA: Diagnosis not present

## 2017-01-30 DIAGNOSIS — R1013 Epigastric pain: Secondary | ICD-10-CM | POA: Diagnosis not present

## 2017-01-30 DIAGNOSIS — E785 Hyperlipidemia, unspecified: Secondary | ICD-10-CM | POA: Diagnosis not present

## 2017-01-30 DIAGNOSIS — K228 Other specified diseases of esophagus: Secondary | ICD-10-CM | POA: Diagnosis not present

## 2017-01-30 DIAGNOSIS — F329 Major depressive disorder, single episode, unspecified: Secondary | ICD-10-CM | POA: Diagnosis not present

## 2017-01-30 DIAGNOSIS — M48061 Spinal stenosis, lumbar region without neurogenic claudication: Secondary | ICD-10-CM | POA: Diagnosis not present

## 2017-01-30 DIAGNOSIS — R1011 Right upper quadrant pain: Secondary | ICD-10-CM | POA: Diagnosis not present

## 2017-01-30 DIAGNOSIS — Z7982 Long term (current) use of aspirin: Secondary | ICD-10-CM | POA: Diagnosis not present

## 2017-01-30 DIAGNOSIS — K222 Esophageal obstruction: Secondary | ICD-10-CM | POA: Diagnosis not present

## 2017-01-30 DIAGNOSIS — Z86010 Personal history of colonic polyps: Secondary | ICD-10-CM | POA: Diagnosis not present

## 2017-01-30 DIAGNOSIS — G43909 Migraine, unspecified, not intractable, without status migrainosus: Secondary | ICD-10-CM | POA: Diagnosis not present

## 2017-01-30 DIAGNOSIS — K769 Liver disease, unspecified: Secondary | ICD-10-CM | POA: Diagnosis not present

## 2017-02-16 DIAGNOSIS — E782 Mixed hyperlipidemia: Secondary | ICD-10-CM | POA: Diagnosis not present

## 2017-02-16 DIAGNOSIS — R7301 Impaired fasting glucose: Secondary | ICD-10-CM | POA: Diagnosis not present

## 2017-02-16 DIAGNOSIS — I119 Hypertensive heart disease without heart failure: Secondary | ICD-10-CM | POA: Diagnosis not present

## 2017-02-16 DIAGNOSIS — I2510 Atherosclerotic heart disease of native coronary artery without angina pectoris: Secondary | ICD-10-CM | POA: Diagnosis not present

## 2017-02-18 ENCOUNTER — Other Ambulatory Visit: Payer: Self-pay | Admitting: Cardiology

## 2017-02-18 DIAGNOSIS — I119 Hypertensive heart disease without heart failure: Secondary | ICD-10-CM | POA: Diagnosis not present

## 2017-02-18 DIAGNOSIS — I2510 Atherosclerotic heart disease of native coronary artery without angina pectoris: Secondary | ICD-10-CM | POA: Diagnosis not present

## 2017-02-18 DIAGNOSIS — N3001 Acute cystitis with hematuria: Secondary | ICD-10-CM | POA: Diagnosis not present

## 2017-02-18 DIAGNOSIS — E782 Mixed hyperlipidemia: Secondary | ICD-10-CM | POA: Diagnosis not present

## 2017-02-18 DIAGNOSIS — Z6838 Body mass index (BMI) 38.0-38.9, adult: Secondary | ICD-10-CM | POA: Diagnosis not present

## 2017-02-18 DIAGNOSIS — G4733 Obstructive sleep apnea (adult) (pediatric): Secondary | ICD-10-CM | POA: Diagnosis not present

## 2017-03-03 DIAGNOSIS — K224 Dyskinesia of esophagus: Secondary | ICD-10-CM | POA: Diagnosis not present

## 2017-03-03 DIAGNOSIS — K760 Fatty (change of) liver, not elsewhere classified: Secondary | ICD-10-CM | POA: Diagnosis not present

## 2017-03-03 DIAGNOSIS — K219 Gastro-esophageal reflux disease without esophagitis: Secondary | ICD-10-CM | POA: Diagnosis not present

## 2017-03-04 ENCOUNTER — Encounter (INDEPENDENT_AMBULATORY_CARE_PROVIDER_SITE_OTHER): Payer: Self-pay | Admitting: Specialist

## 2017-03-04 ENCOUNTER — Ambulatory Visit (INDEPENDENT_AMBULATORY_CARE_PROVIDER_SITE_OTHER): Payer: Medicare Other | Admitting: Specialist

## 2017-03-04 VITALS — BP 144/73 | HR 58 | Ht 61.0 in | Wt 202.0 lb

## 2017-03-04 DIAGNOSIS — M47812 Spondylosis without myelopathy or radiculopathy, cervical region: Secondary | ICD-10-CM

## 2017-03-04 DIAGNOSIS — M461 Sacroiliitis, not elsewhere classified: Secondary | ICD-10-CM | POA: Diagnosis not present

## 2017-03-04 DIAGNOSIS — M5431 Sciatica, right side: Secondary | ICD-10-CM | POA: Diagnosis not present

## 2017-03-04 DIAGNOSIS — M545 Low back pain, unspecified: Secondary | ICD-10-CM

## 2017-03-04 DIAGNOSIS — M47816 Spondylosis without myelopathy or radiculopathy, lumbar region: Secondary | ICD-10-CM | POA: Diagnosis not present

## 2017-03-04 DIAGNOSIS — I2510 Atherosclerotic heart disease of native coronary artery without angina pectoris: Secondary | ICD-10-CM

## 2017-03-04 DIAGNOSIS — Z23 Encounter for immunization: Secondary | ICD-10-CM | POA: Diagnosis not present

## 2017-03-04 DIAGNOSIS — M542 Cervicalgia: Secondary | ICD-10-CM | POA: Diagnosis not present

## 2017-03-04 DIAGNOSIS — N3001 Acute cystitis with hematuria: Secondary | ICD-10-CM | POA: Diagnosis not present

## 2017-03-04 DIAGNOSIS — I10 Essential (primary) hypertension: Secondary | ICD-10-CM | POA: Diagnosis not present

## 2017-03-04 MED ORDER — HYDROCODONE-ACETAMINOPHEN 5-325 MG PO TABS: 1.0000 | ORAL_TABLET | Freq: Four times a day (QID) | ORAL | 0 refills | Status: DC | PRN

## 2017-03-04 NOTE — Patient Instructions (Addendum)
Plan: Avoid frequent bending and stooping  No lifting greater than 10 lbs. May use ice or moist heat for pain. Weight loss is of benefit. Handicap license is approved. She develops tinnitus with any use of advil, naproxyn or other arthritis medications.  She is currently on Sertraline so tramadol or neurotin are not good options due to increased risks of seratonin syndrome.

## 2017-03-04 NOTE — Progress Notes (Signed)
Office Visit Note   Patient: Jessica Gould           Date of Birth: 08/30/1945           MRN: [id number] Visit Date: 03/04/2017              Requested by: Rochel Brome, MD [address], [place] [address] PCP: Rochel Brome, MD   Assessment & Plan: Visit Diagnoses:  1. Sciatica of right side without back pain     Plan: Avoid frequent bending and stooping  No lifting greater than 10 lbs. May use ice or moist heat for pain. Weight loss is of benefit. Handicap license is approved. She develops tinnitus with any use of advil, naproxyn or other arthritis medications.  She is currently on Sertraline so tramadol or neurotin are not good options due to increased risks of seratonin syndrome. .    Follow-Up Instructions: No Follow-up on file.   Orders:  No orders of the defined types were placed in this encounter.  No orders of the defined types were placed in this encounter.     Procedures: No procedures performed   Clinical Data: No additional findings.   Subjective: Chief Complaint  Patient presents with  . Lower Back - Follow-up    71 year old female with history of LBP and no radiation. Worse with bending and stooping and twisting and lifting all increase the pain. No bowel or bladder issues. She is taking tylenol for pain 2 tablets of tylenol at night and 2 tablets in the AM. The hydrocodone was better for pain relief but she is limiting its use.     Review of Systems  Constitutional: Negative.   HENT: Negative.   Eyes: Negative.   Respiratory: Negative.   Cardiovascular: Negative.   Gastrointestinal: Negative.   Endocrine: Negative.   Genitourinary: Negative.   Musculoskeletal: Negative.   Skin: Negative.   Allergic/Immunologic: Negative.   Neurological: Negative.   Hematological: Negative.   Psychiatric/Behavioral: Negative.      Objective: Vital Signs: BP (!) 144/73 (BP Location: Right Arm, Patient Position: Sitting)    Pulse (!) 58   Ht 5\' 1"  (1.549 m)   Wt 202 lb (91.6 kg)   BMI 38.17 kg/m   Physical Exam  Constitutional: She is oriented to person, place, and time. She appears well-developed and well-nourished.  HENT:  Head: Normocephalic and atraumatic.  Eyes: Pupils are equal, round, and reactive to light. EOM are normal.  Neck: Normal range of motion. Neck supple.  Pulmonary/Chest: Effort normal and breath sounds normal.  Abdominal: Soft. Bowel sounds are normal.  Neurological: She is alert and oriented to person, place, and time.  Skin: Skin is warm and dry.  Psychiatric: She has a normal mood and affect. Her behavior is normal. Judgment and thought content normal.    Back Exam   Tenderness  The patient is experiencing tenderness in the lumbar.  Range of Motion  Extension: abnormal  Flexion: normal  Lateral Bend Right: abnormal  Lateral Bend Left: abnormal  Rotation Right: abnormal  Rotation Left: abnormal   Muscle Strength  Right Quadriceps:  5/5  Left Quadriceps:  5/5  Right Hamstrings:  5/5  Left Hamstrings:  5/5   Tests  Straight leg raise right: negative Straight leg raise left: negative  Reflexes  Patellar: normal Achilles: normal Babinski's sign: normal   Other  Toe Walk: normal Heel Walk: normal Sensation: normal Gait: normal  Erythema: no back redness  Scars: present      Specialty Comments:  No specialty comments available.  Imaging: No results found.   PMFS History: Patient Active Problem List   Diagnosis Date Noted  . Morbid obesity ([place]) 01/07/2017  . OSA (obstructive sleep apnea) 11/04/2016  . Dyslipidemia 09/26/2016  . History of anxiety 09/26/2016  . History of depression 09/26/2016  . History of migraine headaches 09/26/2016  . History of coronary artery disease 09/26/2016  . Spinal stenosis of lumbar region 04/05/2013    Class: Diagnosis of  . ESSENTIAL HYPERTENSION, BENIGN 01/09/2009  . DIZZINESS 01/09/2009  . DYSLIPIDEMIA  01/05/2009  . ANXIETY 01/05/2009  . DEPRESSION 01/05/2009  . MIGRAINE HEADACHE 01/05/2009  . 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 SPINAL STENOSIS, LUMBAR 01/05/2009  . COLONIC POLYPS, HX OF 01/05/2009  . History of gastroesophageal reflux (GERD) 01/05/2009   Past Medical History:  Diagnosis Date  . Arthritis   . CAD (coronary artery disease)   . GERD (gastroesophageal reflux disease)   . Headache(784.0)   . PONV (postoperative nausea and vomiting)    BP dropprd once  . Sleep apnea    On CPAP    Family History  Problem Relation Age of Onset  . Heart disease Father 36  . Cancer Father   . Rheumatologic disease Father   . Cancer Mother   . Heart disease Brother 3  . Heart disease Sister 25  . Rheumatologic disease Paternal Grandmother     Past Surgical History:  Procedure Laterality Date  . ABDOMINAL HYSTERECTOMY    . APPENDECTOMY    . BACK SURGERY  2004   lumbar  . BREAST BIOPSY Left 06/23/2006  . BREAST BIOPSY Left 04/12/2003  . BREAST EXCISIONAL BIOPSY Left 07/13/2006  . BREAST EXCISIONAL BIOPSY Left 2004  . CHOLECYSTECTOMY    . DIAGNOSTIC LAPAROSCOPY    . ESOPHAGEAL DILATION  03/31/2013  . HARDWARE REMOVAL N/A 04/04/2013   Procedure: HARDWARE REMOVAL;  Surgeon: Jessy Oto, MD;  Location: [facility];  Service: Orthopedics;  Laterality: N/A;  . LUMBAR LAMINECTOMY N/A 04/04/2013   Procedure: Left L3-4 lateral recess decompression and Left L3-4 foraminotomy, Removal of Rod and Screws Left L4-5;  Surgeon: Jessy Oto, MD;  Location: [facility];  Service: Orthopedics;  Laterality: N/A;  . TONSILLECTOMY Right    nodals removed  . TUBAL LIGATION    . UPPER GI ENDOSCOPY     Social History   Occupational History  . Not on file.   Social History Main Topics  . Smoking status: Never Smoker  . Smokeless tobacco: Never Used  . Alcohol use No  . Drug use: No  . Sexual activity: Not on file

## 2017-03-09 DIAGNOSIS — K760 Fatty (change of) liver, not elsewhere classified: Secondary | ICD-10-CM | POA: Diagnosis not present

## 2017-03-09 DIAGNOSIS — K222 Esophageal obstruction: Secondary | ICD-10-CM | POA: Diagnosis not present

## 2017-03-09 DIAGNOSIS — K224 Dyskinesia of esophagus: Secondary | ICD-10-CM | POA: Diagnosis not present

## 2017-03-09 DIAGNOSIS — R1310 Dysphagia, unspecified: Secondary | ICD-10-CM | POA: Diagnosis not present

## 2017-03-10 DIAGNOSIS — K760 Fatty (change of) liver, not elsewhere classified: Secondary | ICD-10-CM | POA: Insufficient documentation

## 2017-03-27 DIAGNOSIS — N3000 Acute cystitis without hematuria: Secondary | ICD-10-CM | POA: Diagnosis not present

## 2017-04-14 DIAGNOSIS — I2510 Atherosclerotic heart disease of native coronary artery without angina pectoris: Secondary | ICD-10-CM | POA: Diagnosis not present

## 2017-04-14 DIAGNOSIS — K317 Polyp of stomach and duodenum: Secondary | ICD-10-CM | POA: Diagnosis not present

## 2017-04-14 DIAGNOSIS — D131 Benign neoplasm of stomach: Secondary | ICD-10-CM | POA: Diagnosis not present

## 2017-04-14 DIAGNOSIS — R1310 Dysphagia, unspecified: Secondary | ICD-10-CM | POA: Diagnosis not present

## 2017-04-14 DIAGNOSIS — Z8673 Personal history of transient ischemic attack (TIA), and cerebral infarction without residual deficits: Secondary | ICD-10-CM | POA: Diagnosis not present

## 2017-04-14 DIAGNOSIS — K219 Gastro-esophageal reflux disease without esophagitis: Secondary | ICD-10-CM | POA: Diagnosis not present

## 2017-04-14 DIAGNOSIS — G4730 Sleep apnea, unspecified: Secondary | ICD-10-CM | POA: Diagnosis not present

## 2017-04-14 DIAGNOSIS — K228 Other specified diseases of esophagus: Secondary | ICD-10-CM | POA: Diagnosis not present

## 2017-04-14 DIAGNOSIS — K224 Dyskinesia of esophagus: Secondary | ICD-10-CM | POA: Diagnosis not present

## 2017-04-14 DIAGNOSIS — Z7982 Long term (current) use of aspirin: Secondary | ICD-10-CM | POA: Diagnosis not present

## 2017-04-14 DIAGNOSIS — Z880 Allergy status to penicillin: Secondary | ICD-10-CM | POA: Diagnosis not present

## 2017-04-14 DIAGNOSIS — I10 Essential (primary) hypertension: Secondary | ICD-10-CM | POA: Diagnosis not present

## 2017-04-14 DIAGNOSIS — K760 Fatty (change of) liver, not elsewhere classified: Secondary | ICD-10-CM | POA: Diagnosis not present

## 2017-04-14 DIAGNOSIS — Z79899 Other long term (current) drug therapy: Secondary | ICD-10-CM | POA: Diagnosis not present

## 2017-04-14 DIAGNOSIS — F329 Major depressive disorder, single episode, unspecified: Secondary | ICD-10-CM | POA: Diagnosis not present

## 2017-04-14 DIAGNOSIS — E7800 Pure hypercholesterolemia, unspecified: Secondary | ICD-10-CM | POA: Diagnosis not present

## 2017-04-14 DIAGNOSIS — M1990 Unspecified osteoarthritis, unspecified site: Secondary | ICD-10-CM | POA: Diagnosis not present

## 2017-04-14 DIAGNOSIS — Z885 Allergy status to narcotic agent status: Secondary | ICD-10-CM | POA: Diagnosis not present

## 2017-04-14 DIAGNOSIS — Z9049 Acquired absence of other specified parts of digestive tract: Secondary | ICD-10-CM | POA: Diagnosis not present

## 2017-04-24 DIAGNOSIS — S83241A Other tear of medial meniscus, current injury, right knee, initial encounter: Secondary | ICD-10-CM | POA: Diagnosis not present

## 2017-05-11 DIAGNOSIS — K760 Fatty (change of) liver, not elsewhere classified: Secondary | ICD-10-CM | POA: Diagnosis not present

## 2017-05-11 DIAGNOSIS — Z881 Allergy status to other antibiotic agents status: Secondary | ICD-10-CM | POA: Diagnosis not present

## 2017-05-11 DIAGNOSIS — R1011 Right upper quadrant pain: Secondary | ICD-10-CM | POA: Diagnosis not present

## 2017-05-11 DIAGNOSIS — E7800 Pure hypercholesterolemia, unspecified: Secondary | ICD-10-CM | POA: Diagnosis not present

## 2017-05-11 DIAGNOSIS — I10 Essential (primary) hypertension: Secondary | ICD-10-CM | POA: Diagnosis not present

## 2017-05-11 DIAGNOSIS — Z79899 Other long term (current) drug therapy: Secondary | ICD-10-CM | POA: Diagnosis not present

## 2017-05-11 DIAGNOSIS — Z885 Allergy status to narcotic agent status: Secondary | ICD-10-CM | POA: Diagnosis not present

## 2017-05-11 DIAGNOSIS — Z880 Allergy status to penicillin: Secondary | ICD-10-CM | POA: Diagnosis not present

## 2017-05-11 DIAGNOSIS — Z7289 Other problems related to lifestyle: Secondary | ICD-10-CM | POA: Diagnosis not present

## 2017-06-02 DIAGNOSIS — E782 Mixed hyperlipidemia: Secondary | ICD-10-CM | POA: Diagnosis not present

## 2017-06-02 DIAGNOSIS — I2510 Atherosclerotic heart disease of native coronary artery without angina pectoris: Secondary | ICD-10-CM | POA: Diagnosis not present

## 2017-06-02 DIAGNOSIS — N3000 Acute cystitis without hematuria: Secondary | ICD-10-CM | POA: Diagnosis not present

## 2017-06-02 DIAGNOSIS — Z6838 Body mass index (BMI) 38.0-38.9, adult: Secondary | ICD-10-CM | POA: Diagnosis not present

## 2017-06-02 DIAGNOSIS — R7301 Impaired fasting glucose: Secondary | ICD-10-CM | POA: Diagnosis not present

## 2017-06-02 DIAGNOSIS — G4733 Obstructive sleep apnea (adult) (pediatric): Secondary | ICD-10-CM | POA: Diagnosis not present

## 2017-06-02 DIAGNOSIS — M797 Fibromyalgia: Secondary | ICD-10-CM | POA: Diagnosis not present

## 2017-06-02 DIAGNOSIS — I119 Hypertensive heart disease without heart failure: Secondary | ICD-10-CM | POA: Diagnosis not present

## 2017-06-03 DIAGNOSIS — K219 Gastro-esophageal reflux disease without esophagitis: Secondary | ICD-10-CM | POA: Diagnosis not present

## 2017-06-03 DIAGNOSIS — E7800 Pure hypercholesterolemia, unspecified: Secondary | ICD-10-CM | POA: Diagnosis not present

## 2017-06-03 DIAGNOSIS — G43909 Migraine, unspecified, not intractable, without status migrainosus: Secondary | ICD-10-CM | POA: Diagnosis not present

## 2017-06-03 DIAGNOSIS — I10 Essential (primary) hypertension: Secondary | ICD-10-CM | POA: Diagnosis not present

## 2017-06-03 DIAGNOSIS — R1310 Dysphagia, unspecified: Secondary | ICD-10-CM | POA: Diagnosis not present

## 2017-06-03 DIAGNOSIS — Z885 Allergy status to narcotic agent status: Secondary | ICD-10-CM | POA: Diagnosis not present

## 2017-06-03 DIAGNOSIS — A049 Bacterial intestinal infection, unspecified: Secondary | ICD-10-CM | POA: Diagnosis not present

## 2017-06-03 DIAGNOSIS — Z7982 Long term (current) use of aspirin: Secondary | ICD-10-CM | POA: Diagnosis not present

## 2017-06-03 DIAGNOSIS — K760 Fatty (change of) liver, not elsewhere classified: Secondary | ICD-10-CM | POA: Diagnosis not present

## 2017-06-03 DIAGNOSIS — M1990 Unspecified osteoarthritis, unspecified site: Secondary | ICD-10-CM | POA: Diagnosis not present

## 2017-06-03 DIAGNOSIS — I2510 Atherosclerotic heart disease of native coronary artery without angina pectoris: Secondary | ICD-10-CM | POA: Diagnosis not present

## 2017-06-03 DIAGNOSIS — Z880 Allergy status to penicillin: Secondary | ICD-10-CM | POA: Diagnosis not present

## 2017-06-03 DIAGNOSIS — K229 Disease of esophagus, unspecified: Secondary | ICD-10-CM | POA: Diagnosis not present

## 2017-06-04 DIAGNOSIS — H2512 Age-related nuclear cataract, left eye: Secondary | ICD-10-CM | POA: Diagnosis not present

## 2017-06-04 DIAGNOSIS — H35033 Hypertensive retinopathy, bilateral: Secondary | ICD-10-CM | POA: Diagnosis not present

## 2017-06-04 DIAGNOSIS — H43822 Vitreomacular adhesion, left eye: Secondary | ICD-10-CM | POA: Diagnosis not present

## 2017-06-04 DIAGNOSIS — H25012 Cortical age-related cataract, left eye: Secondary | ICD-10-CM | POA: Diagnosis not present

## 2017-06-04 DIAGNOSIS — H35372 Puckering of macula, left eye: Secondary | ICD-10-CM | POA: Diagnosis not present

## 2017-06-10 ENCOUNTER — Encounter (INDEPENDENT_AMBULATORY_CARE_PROVIDER_SITE_OTHER): Payer: Self-pay

## 2017-06-10 ENCOUNTER — Ambulatory Visit (INDEPENDENT_AMBULATORY_CARE_PROVIDER_SITE_OTHER): Payer: Medicare Other | Admitting: Specialist

## 2017-06-25 ENCOUNTER — Ambulatory Visit (INDEPENDENT_AMBULATORY_CARE_PROVIDER_SITE_OTHER): Payer: Medicare Other | Admitting: Specialist

## 2017-06-25 ENCOUNTER — Ambulatory Visit (INDEPENDENT_AMBULATORY_CARE_PROVIDER_SITE_OTHER): Payer: Medicare Other

## 2017-06-25 ENCOUNTER — Encounter (INDEPENDENT_AMBULATORY_CARE_PROVIDER_SITE_OTHER): Payer: Self-pay | Admitting: Specialist

## 2017-06-25 VITALS — BP 185/85 | Ht 61.0 in | Wt 202.0 lb

## 2017-06-25 DIAGNOSIS — M545 Low back pain: Secondary | ICD-10-CM

## 2017-06-25 DIAGNOSIS — M5136 Other intervertebral disc degeneration, lumbar region: Secondary | ICD-10-CM | POA: Diagnosis not present

## 2017-06-25 MED ORDER — TRAMADOL HCL 50 MG PO TABS: 50.0000 mg | ORAL_TABLET | Freq: Four times a day (QID) | ORAL | 0 refills | Status: DC | PRN

## 2017-06-25 MED ORDER — METHOCARBAMOL 500 MG PO TABS: 500.0000 mg | ORAL_TABLET | Freq: Four times a day (QID) | ORAL | 1 refills | Status: DC

## 2017-06-25 NOTE — Progress Notes (Signed)
Office Visit Note   Patient: Jessica Gould           Date of Birth: 22-May-1946           MRN: [id number] Visit Date: 06/25/2017              Requested by: Rochel Brome, MD [address] [place], [place] [address] PCP: Rochel Brome, MD   Assessment & Plan: Visit Diagnoses:  1. Low back pain, unspecified back pain laterality, unspecified chronicity, with sciatica presence unspecified   2. Degenerative disc disease, lumbar     Plan:Avoid frequent bending and stooping  No lifting greater than 10 lbs. May use ice or moist heat for pain. Weight loss is of benefit. Handicap license is approved.   Follow-Up Instructions: No Follow-up on file.   Orders:  Orders Placed This Encounter  Procedures  . XR Lumbar Spine 2-3 Views   No orders of the defined types were placed in this encounter.     Procedures: No procedures performed   Clinical Data: No additional findings.   Subjective: Chief Complaint  Patient presents with  . Lower Back - Follow-up    72 year old female with history of L4-5 fusion for spondylolisthesis with DDD, had perioperative nerve compression due to plate pressing down of the fusion masses and L4 nerve root  Required remove of hardware while post op and she kept one pair of pedicle screws and rod. When on to heal her fusion then had a bout of neurogenic claudication with recurring foramenal stenosis underwent removal of hardware and decompression of  The right neuroforamen.  She reports doing well until getting out of her car and twisting her back with     Review of Systems  Constitutional: Negative.   HENT: Negative.   Eyes: Negative.   Respiratory: Negative.   Cardiovascular: Negative.   Gastrointestinal: Negative.   Endocrine: Negative.   Genitourinary: Negative.   Musculoskeletal: Negative.   Skin: Negative.   Allergic/Immunologic: Negative.   Neurological: Negative.   Hematological: Negative.   Psychiatric/Behavioral:  Negative.      Objective: Vital Signs: BP (!) 185/85 (BP Location: Right Arm, Patient Position: Sitting)   Ht 5\' 1"  (1.549 m)   Wt 202 lb (91.6 kg)   BMI 38.17 kg/m   Physical Exam  Back Exam   Tenderness  The patient is experiencing tenderness in the lumbar.  Range of Motion  Extension: abnormal  Flexion: abnormal  Lateral bend right: abnormal  Lateral bend left: abnormal  Rotation right: abnormal  Rotation left: abnormal   Muscle Strength  Right Quadriceps:  5/5  Left Quadriceps:  5/5  Right Hamstrings:  5/5  Left Hamstrings:  5/5   Tests  Straight leg raise right: negative Straight leg raise left: negative  Reflexes  Patellar: normal Achilles: normal Biceps: normal  Other  Toe walk: normal Heel walk: normal Sensation: normal      Specialty Comments:  No specialty comments available.  Imaging: Xr Lumbar Spine 2-3 Views  Result Date: 06/25/2017 AP and lateral flexion and extension radiographs show moderate degeneration and disc narrowing at the L3-4 level, solid anterior fusion at the L4-5 level with posterolateral fusion masses. There is mild disc changes L2-3 and L1-2 less at L5-S1.      PMFS History: Patient Active Problem List   Diagnosis Date Noted  . Morbid obesity ([place]) 01/07/2017  . OSA (obstructive sleep apnea) 11/04/2016  . Dyslipidemia 09/26/2016  . History of anxiety  09/26/2016  . History of depression 09/26/2016  . History of migraine headaches 09/26/2016  . History of coronary artery disease 09/26/2016  . Spinal stenosis of lumbar region 04/05/2013    Class: Diagnosis of  . ESSENTIAL HYPERTENSION, BENIGN 01/09/2009  . DIZZINESS 01/09/2009  . DYSLIPIDEMIA 01/05/2009  . ANXIETY 01/05/2009  . DEPRESSION 01/05/2009  . MIGRAINE HEADACHE 01/05/2009  . 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 SPINAL STENOSIS, LUMBAR 01/05/2009  . COLONIC POLYPS, HX OF 01/05/2009  . History of gastroesophageal reflux (GERD) 01/05/2009   Past Medical History:    Diagnosis Date  . Arthritis   . CAD (coronary artery disease)   . GERD (gastroesophageal reflux disease)   . Headache(784.0)   . PONV (postoperative nausea and vomiting)    BP dropprd once  . Sleep apnea    On CPAP    Family History  Problem Relation Age of Onset  . Heart disease Father 78  . Cancer Father   . Rheumatologic disease Father   . Cancer Mother   . Heart disease Brother 41  . Heart disease Sister 75  . Rheumatologic disease Paternal Grandmother     Past Surgical History:  Procedure Laterality Date  . ABDOMINAL HYSTERECTOMY    . APPENDECTOMY    . BACK SURGERY  2004   lumbar  . BREAST BIOPSY Left 06/23/2006  . BREAST BIOPSY Left 04/12/2003  . BREAST EXCISIONAL BIOPSY Left 07/13/2006  . BREAST EXCISIONAL BIOPSY Left 2004  . CHOLECYSTECTOMY    . DIAGNOSTIC LAPAROSCOPY    . ESOPHAGEAL DILATION  03/31/2013  . HARDWARE REMOVAL N/A 04/04/2013   Procedure: HARDWARE REMOVAL;  Surgeon: Jessy Oto, MD;  Location: [facility];  Service: Orthopedics;  Laterality: N/A;  . LUMBAR LAMINECTOMY N/A 04/04/2013   Procedure: Left L3-4 lateral recess decompression and Left L3-4 foraminotomy, Removal of Rod and Screws Left L4-5;  Surgeon: Jessy Oto, MD;  Location: [facility];  Service: Orthopedics;  Laterality: N/A;  . TONSILLECTOMY Right    nodals removed  . TUBAL LIGATION    . UPPER GI ENDOSCOPY     Social History   Occupational History  . Not on file  Tobacco Use  . Smoking status: Never Smoker  . Smokeless tobacco: Never Used  Substance and Sexual Activity  . Alcohol use: No    Alcohol/week: 0.0 oz  . Drug use: No  . Sexual activity: Not on file

## 2017-06-25 NOTE — Patient Instructions (Signed)
Avoid frequent bending and stooping  No lifting greater than 10 lbs. May use ice or moist heat for pain. Weight loss is of benefit. Handicap license is approved.   

## 2017-06-29 DIAGNOSIS — K229 Disease of esophagus, unspecified: Secondary | ICD-10-CM | POA: Diagnosis not present

## 2017-06-30 DIAGNOSIS — E782 Mixed hyperlipidemia: Secondary | ICD-10-CM | POA: Diagnosis not present

## 2017-06-30 DIAGNOSIS — Z6839 Body mass index (BMI) 39.0-39.9, adult: Secondary | ICD-10-CM | POA: Diagnosis not present

## 2017-06-30 DIAGNOSIS — M797 Fibromyalgia: Secondary | ICD-10-CM | POA: Diagnosis not present

## 2017-06-30 DIAGNOSIS — K224 Dyskinesia of esophagus: Secondary | ICD-10-CM | POA: Diagnosis not present

## 2017-06-30 DIAGNOSIS — K7581 Nonalcoholic steatohepatitis (NASH): Secondary | ICD-10-CM | POA: Diagnosis not present

## 2017-07-01 DIAGNOSIS — L600 Ingrowing nail: Secondary | ICD-10-CM | POA: Diagnosis not present

## 2017-07-01 DIAGNOSIS — L03032 Cellulitis of left toe: Secondary | ICD-10-CM | POA: Diagnosis not present

## 2017-07-07 DIAGNOSIS — R079 Chest pain, unspecified: Secondary | ICD-10-CM | POA: Diagnosis not present

## 2017-07-07 DIAGNOSIS — Z79899 Other long term (current) drug therapy: Secondary | ICD-10-CM | POA: Diagnosis not present

## 2017-07-07 DIAGNOSIS — I160 Hypertensive urgency: Secondary | ICD-10-CM | POA: Diagnosis not present

## 2017-07-07 DIAGNOSIS — R030 Elevated blood-pressure reading, without diagnosis of hypertension: Secondary | ICD-10-CM | POA: Diagnosis not present

## 2017-07-07 DIAGNOSIS — Z6838 Body mass index (BMI) 38.0-38.9, adult: Secondary | ICD-10-CM | POA: Diagnosis not present

## 2017-07-07 DIAGNOSIS — K209 Esophagitis, unspecified: Secondary | ICD-10-CM | POA: Diagnosis not present

## 2017-07-07 DIAGNOSIS — Z7982 Long term (current) use of aspirin: Secondary | ICD-10-CM | POA: Diagnosis not present

## 2017-07-07 DIAGNOSIS — R0789 Other chest pain: Secondary | ICD-10-CM | POA: Diagnosis not present

## 2017-07-07 DIAGNOSIS — Z955 Presence of coronary angioplasty implant and graft: Secondary | ICD-10-CM | POA: Diagnosis not present

## 2017-07-07 DIAGNOSIS — E785 Hyperlipidemia, unspecified: Secondary | ICD-10-CM | POA: Diagnosis not present

## 2017-07-07 DIAGNOSIS — I10 Essential (primary) hypertension: Secondary | ICD-10-CM | POA: Diagnosis not present

## 2017-07-08 DIAGNOSIS — K760 Fatty (change of) liver, not elsewhere classified: Secondary | ICD-10-CM | POA: Diagnosis not present

## 2017-07-10 ENCOUNTER — Ambulatory Visit: Payer: Medicare Other | Admitting: Adult Health

## 2017-07-10 DIAGNOSIS — G4733 Obstructive sleep apnea (adult) (pediatric): Secondary | ICD-10-CM | POA: Diagnosis present

## 2017-07-10 DIAGNOSIS — K220 Achalasia of cardia: Secondary | ICD-10-CM | POA: Diagnosis not present

## 2017-07-10 DIAGNOSIS — I2510 Atherosclerotic heart disease of native coronary artery without angina pectoris: Secondary | ICD-10-CM | POA: Diagnosis present

## 2017-07-10 DIAGNOSIS — F329 Major depressive disorder, single episode, unspecified: Secondary | ICD-10-CM | POA: Diagnosis present

## 2017-07-10 DIAGNOSIS — I10 Essential (primary) hypertension: Secondary | ICD-10-CM | POA: Diagnosis present

## 2017-07-10 DIAGNOSIS — E785 Hyperlipidemia, unspecified: Secondary | ICD-10-CM | POA: Diagnosis present

## 2017-07-10 DIAGNOSIS — Z8673 Personal history of transient ischemic attack (TIA), and cerebral infarction without residual deficits: Secondary | ICD-10-CM | POA: Diagnosis not present

## 2017-07-10 DIAGNOSIS — K760 Fatty (change of) liver, not elsewhere classified: Secondary | ICD-10-CM | POA: Diagnosis present

## 2017-07-10 DIAGNOSIS — Z7982 Long term (current) use of aspirin: Secondary | ICD-10-CM | POA: Diagnosis not present

## 2017-07-10 DIAGNOSIS — Z9889 Other specified postprocedural states: Secondary | ICD-10-CM | POA: Insufficient documentation

## 2017-07-10 DIAGNOSIS — K219 Gastro-esophageal reflux disease without esophagitis: Secondary | ICD-10-CM | POA: Diagnosis present

## 2017-07-10 DIAGNOSIS — E7800 Pure hypercholesterolemia, unspecified: Secondary | ICD-10-CM | POA: Diagnosis present

## 2017-07-10 DIAGNOSIS — K228 Other specified diseases of esophagus: Secondary | ICD-10-CM | POA: Diagnosis not present

## 2017-07-10 DIAGNOSIS — K224 Dyskinesia of esophagus: Secondary | ICD-10-CM | POA: Diagnosis not present

## 2017-07-12 MED ORDER — CLINDAMYCIN HCL 150 MG PO CAPS
150.00 mg | ORAL_CAPSULE | ORAL | Status: DC
Start: 2017-07-12 — End: 2017-07-12

## 2017-07-12 MED ORDER — HYDROCODONE-ACETAMINOPHEN 7.5-325 MG/15ML PO SOLN
20.00 | ORAL | Status: DC
Start: ? — End: 2017-07-12

## 2017-07-12 MED ORDER — ASPIRIN 81 MG PO CHEW
81.00 mg | CHEWABLE_TABLET | ORAL | Status: DC
Start: 2017-07-13 — End: 2017-07-12

## 2017-07-12 MED ORDER — ATORVASTATIN CALCIUM 40 MG PO TABS
40.00 mg | ORAL_TABLET | ORAL | Status: DC
Start: 2017-07-13 — End: 2017-07-12

## 2017-07-12 MED ORDER — GENERIC EXTERNAL MEDICATION
Status: DC
Start: ? — End: 2017-07-12

## 2017-07-12 MED ORDER — HEPARIN SODIUM (PORCINE) 5000 UNIT/ML IJ SOLN
5000.00 | INTRAMUSCULAR | Status: DC
Start: 2017-07-12 — End: 2017-07-12

## 2017-07-12 MED ORDER — ONDANSETRON HCL 4 MG/2ML IJ SOLN
4.00 mg | INTRAMUSCULAR | Status: DC
Start: ? — End: 2017-07-12

## 2017-07-12 MED ORDER — METOPROLOL TARTRATE 50 MG PO TABS
50.00 mg | ORAL_TABLET | ORAL | Status: DC
Start: 2017-07-12 — End: 2017-07-12

## 2017-07-12 MED ORDER — NALOXONE HCL 0.4 MG/ML IJ SOLN
0.40 mg | INTRAMUSCULAR | Status: DC
Start: ? — End: 2017-07-12

## 2017-07-12 MED ORDER — DOCUSATE SODIUM 150 MG/15ML PO LIQD
100.00 mg | ORAL | Status: DC
Start: 2017-07-13 — End: 2017-07-12

## 2017-07-12 MED ORDER — ACETAMINOPHEN 650 MG/20.3ML PO SOLN
650.00 mg | ORAL | Status: DC
Start: ? — End: 2017-07-12

## 2017-07-12 MED ORDER — SERTRALINE HCL 50 MG PO TABS
50.00 mg | ORAL_TABLET | ORAL | Status: DC
Start: 2017-07-13 — End: 2017-07-12

## 2017-07-12 MED ORDER — POLYETHYLENE GLYCOL 3350 17 G PO PACK
17.00 | PACK | ORAL | Status: DC
Start: 2017-07-13 — End: 2017-07-12

## 2017-07-15 DIAGNOSIS — K224 Dyskinesia of esophagus: Secondary | ICD-10-CM | POA: Diagnosis not present

## 2017-07-15 DIAGNOSIS — K220 Achalasia of cardia: Secondary | ICD-10-CM | POA: Diagnosis not present

## 2017-07-24 ENCOUNTER — Ambulatory Visit (INDEPENDENT_AMBULATORY_CARE_PROVIDER_SITE_OTHER): Payer: Medicare Other | Admitting: Specialist

## 2017-07-27 DIAGNOSIS — Z09 Encounter for follow-up examination after completed treatment for conditions other than malignant neoplasm: Secondary | ICD-10-CM | POA: Diagnosis not present

## 2017-07-27 DIAGNOSIS — K229 Disease of esophagus, unspecified: Secondary | ICD-10-CM | POA: Diagnosis not present

## 2017-07-29 ENCOUNTER — Ambulatory Visit: Payer: Medicare Other | Admitting: Adult Health

## 2017-08-04 ENCOUNTER — Encounter: Payer: Self-pay | Admitting: Adult Health

## 2017-08-04 ENCOUNTER — Ambulatory Visit (INDEPENDENT_AMBULATORY_CARE_PROVIDER_SITE_OTHER): Payer: Medicare Other | Admitting: Adult Health

## 2017-08-04 DIAGNOSIS — G4733 Obstructive sleep apnea (adult) (pediatric): Secondary | ICD-10-CM

## 2017-08-04 NOTE — Progress Notes (Signed)
Reviewed and agree with assessment/plan.   Mayre Bury, MD [facility] 05/21/2016, 12:24 PM Pager:  [phone]  

## 2017-08-04 NOTE — Addendum Note (Signed)
Addended by: Parke Poisson E on: 08/04/2017 03:29 PM   Modules accepted: Orders

## 2017-08-04 NOTE — Assessment & Plan Note (Signed)
Wt loss  

## 2017-08-04 NOTE — Patient Instructions (Addendum)
Continue on C Pap at bedtime Keep up good work.  Change CPAP auto set 5 to 12cm .  Wear for at least 4-6 hours each night Work on healthy weight Do not drive if sleepy Follow-up in 1 year with Dr.Sood and As needed

## 2017-08-04 NOTE — Progress Notes (Signed)
72 y/o F never smoker, OSA presents today for a 6 month follow-up for OSA.  Mask is fitting well, feels well rested after CPAP use.  However, the pressure may be too much on the machine because she wakes up in the middle of the night with forceful air in her face.  Denies cough, SOB, or falling asleep during the daytime.  Endorses snoring when not using the CPAP.  Pt started using a heated hose with her CPAP 2 months ago because she was waking up congested and this has resolved since she started using the heated hose with her CPAP.  CPAP compliance report 07/03/17 to 08/01/17: Average use (total days) is 3 hours and 17 minutes, Average use (days used) is 5 hours and 12 minutes. AHI=2.8  Pt s/p laparoscopic heller myotopy and toupet fundoplication surgery on 0/92/33 at [facility] and hasn't used the CPAP since surgery ( 2 weeks).    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    PE:  GEN: A/Ox3; pleasant , NAD, well nourished    HEENT:  [place]/AT,  EACs-clear, TMs-wnl, NOSE-clear, THROAT-clear, no lesions, no postnasal drip or exudate noted.  Class 2 MP airway   NECK:  Supple w/ fair ROM; no JVD    RESP  Clear  P & A; w/o, wheezes/ rales/ or rhonchi. no accessory muscle use  CARD:  RRR, no m/r/g, no peripheral edema, pulses intact, no cyanosis or clubbing.  GI:  Soft & nt; nml bowel sounds; no organomegaly or masses detected.   Musco: Warm bil, no deformities or joint swelling noted.   Neuro: alert, no focal deficits noted.    Skin: Warm, no lesions or rashes   I/P OSA Resume using CPAP at bedtime as soon as possible Will adjust pressure on CPAP machine to 5-12 Wear CPAP for at least 4-6 hours each night Continue to work on weight loss Do not drive a sleepy Follow-up in one year with Dr.Sood or [place]

## 2017-08-04 NOTE — Assessment & Plan Note (Addendum)
Moderate sleep apnea on CPAP.  Patient is encouraged on compliance.  We will adjust pressure settings for comfort  Plan  Patient Instructions  Continue on C Pap at bedtime Keep up good work.  Change CPAP auto set 5 to 12cm .  Wear for at least 4-6 hours each night Work on healthy weight Do not drive if sleepy Follow-up in 1 year with Dr.Sood and As needed

## 2017-08-04 NOTE — Progress Notes (Signed)
@Patient  ID: Jessica Gould, female    DOB: [birth year], 72 y.o.   MRN: [id number]  Chief Complaint  Patient presents with  . Follow-up    OSA    Referring provider: Rochel Brome, MD  HPI: 72 year old female seen for sleep consult September 2016 found to have OSA   TEST  HST 03/05/15 >> AHI 18.3, SaO2 low 77%  08/04/2017 Follow up  :OSA  Patient presents for a six-month follow-up.  Patient has underlying moderate sleep apnea.  On CPAP at bedtime.  Says he tries to wear it each night.  Feels more rested when she wears her CPAP.  Download shows 63% compliance.  Average usage at 5-6 hours.  AHI 2.8.  Patient says she does feel that the pressure has been a little high lately. On CPAP 5 to 15 cm H2O .  Had recent surgery was not able to wear in recovery period.    Allergies  Allergen Reactions  . Sulfamethoxazole-Trimethoprim Swelling, Anaphylaxis and Rash    lips lips  . Statins Other (See Comments)    Other reaction(s): Other (See Comments) Body aches Nausea Body aches Nausea Body aches Nausea  . Duloxetine     Cymbalta= unknown Cymbalta= unknown  . Other     "Cat Gut Sutures"= abscess  . Oxycontin [Oxycodone Hcl]     migraine headache  . Erythromycin Nausea And Vomiting    Abdominal pain Abdominal pain  . Penicillins Itching and Rash  . Phenobarbital Rash    Immunization History  Administered Date(s) Administered  . Influenza, High Dose Seasonal PF 02/24/2016, 02/23/2017  . Pneumococcal Polysaccharide-23 02/24/2016  . Zoster 12/23/2016    Past Medical History:  Diagnosis Date  . Arthritis   . CAD (coronary artery disease)   . GERD (gastroesophageal reflux disease)   . Headache(784.0)   . PONV (postoperative nausea and vomiting)    BP dropprd once  . Sleep apnea    On CPAP    Tobacco History: Social History   Tobacco Use  Smoking Status Never Smoker  Smokeless Tobacco Never Used   Counseling given: Not Answered   Outpatient Encounter  Medications as of 08/04/2017  Medication Sig  . amLODipine (NORVASC) 2.5 MG tablet Take 1 tablet (2.5 mg total) by mouth daily.  Marland Kitchen aspirin EC 81 MG tablet Take 1 tablet (81 mg total) by mouth daily.  . methocarbamol (ROBAXIN) 500 MG tablet Take 1 tablet (500 mg total) by mouth 4 (four) times daily.  . metoprolol succinate (TOPROL-XL) 100 MG 24 hr tablet Take 1 tablet (100 mg total) by mouth daily.  . nitroGLYCERIN (NITROSTAT) 0.4 MG SL tablet Place 1 tablet (0.4 mg total) under the tongue every 5 (five) minutes as needed for chest pain.  . pantoprazole (PROTONIX) 40 MG tablet Take 40 mg by mouth daily.   . sertraline (ZOLOFT) 50 MG tablet Take 1 tablet (50 mg total) by mouth daily.  . traMADol (ULTRAM) 50 MG tablet Take 1 tablet (50 mg total) by mouth every 6 (six) hours as needed.  . rosuvastatin (CRESTOR) 20 MG tablet Take 1 tablet (20 mg total) by mouth daily.  . [DISCONTINUED] HYDROcodone-acetaminophen (NORCO/VICODIN) 5-325 MG tablet Take 1 tablet by mouth every 6 (six) hours as needed for moderate pain. (Patient not taking: Reported on 08/04/2017)   No facility-administered encounter medications on file as of 08/04/2017.      Review of Systems  Constitutional:   No  weight loss, night sweats,  Fevers, chills,  fatigue, or  lassitude.  HEENT:   No headaches,  Difficulty swallowing,  Tooth/dental problems, or  Sore throat,                No sneezing, itching, ear ache, nasal congestion, post nasal drip,   CV:  No chest pain,  Orthopnea, PND, swelling in lower extremities, anasarca, dizziness, palpitations, syncope.   GI  No heartburn, indigestion, abdominal pain, nausea, vomiting, diarrhea, change in bowel habits, loss of appetite, bloody stools.   Resp: No shortness of breath with exertion or at rest.  No excess mucus, no productive cough,  No non-productive cough,  No coughing up of blood.  No change in color of mucus.  No wheezing.  No chest wall deformity  Skin: no rash or  lesions.  GU: no dysuria, change in color of urine, no urgency or frequency.  No flank pain, no hematuria   MS:  No joint pain or swelling.  No decreased range of motion.  No back pain.    Physical Exam  BP 134/70 (BP Location: Right Arm, Cuff Size: Large)   Pulse 61   Ht 5\' 1"  (1.549 m)   Wt 196 lb (88.9 kg)   SpO2 92%   BMI 37.03 kg/m   GEN: A/Ox3; pleasant , NAD, obese    HEENT:  [place]/AT,  EACs-clear, TMs-wnl, NOSE-clear, THROAT-clear, no lesions, no postnasal drip or exudate noted. Class 2 MP airway   NECK:  Supple w/ fair ROM; no JVD; normal carotid impulses w/o bruits; no thyromegaly or nodules palpated; no lymphadenopathy.    RESP  Clear  P & A; w/o, wheezes/ rales/ or rhonchi. no accessory muscle use, no dullness to percussion  CARD:  RRR, no m/r/g, no peripheral edema, pulses intact, no cyanosis or clubbing.  GI:   Soft & nt; nml bowel sounds; no organomegaly or masses detected.   Musco: Warm bil, no deformities or joint swelling noted.   Neuro: alert, no focal deficits noted.    Skin: Warm, no lesions or rashes    Lab Results:  CBC    Component Value Date/Time   WBC 6.9 09/26/2016 1221   RBC 4.58 09/26/2016 1221   HGB 13.7 09/26/2016 1221   HCT 42.3 09/26/2016 1221   PLT 206 09/26/2016 1221   MCV 92.4 09/26/2016 1221   MCH 29.9 09/26/2016 1221   MCHC 32.4 09/26/2016 1221   RDW 14.2 09/26/2016 1221   LYMPHSABS 1,449 09/26/2016 1221   MONOABS 552 09/26/2016 1221   EOSABS 276 09/26/2016 1221   BASOSABS 0 09/26/2016 1221    BMET    Component Value Date/Time   NA 141 09/26/2016 1221   K 4.4 09/26/2016 1221   CL 106 09/26/2016 1221   CO2 23 09/26/2016 1221   GLUCOSE 87 09/26/2016 1221   BUN 19 09/26/2016 1221   CREATININE 1.03 (H) 09/26/2016 1221   CALCIUM 9.5 09/26/2016 1221   GFRNONAA 54 (L) 09/26/2016 1221   GFRAA 63 09/26/2016 1221    BNP No results found for: BNP  ProBNP No results found for: PROBNP  Imaging: No results  found.   Assessment & Plan:   OSA (obstructive sleep apnea) Moderate sleep apnea on CPAP.  Patient is encouraged on compliance.  We will adjust pressure settings for comfort  Plan  Patient Instructions  Continue on C Pap at bedtime Keep up good work.  Change CPAP auto set 5 to 12cm .  Wear for at least 4-6 hours each night Work on  healthy weight Do not drive if sleepy Follow-up in 1 year with Dr.Sood and As needed        Morbid obesity ([place]) Wt loss      Rexene Edison, NP 08/04/2017

## 2017-08-07 DIAGNOSIS — T8131XS Disruption of external operation (surgical) wound, not elsewhere classified, sequela: Secondary | ICD-10-CM | POA: Diagnosis not present

## 2017-08-14 DIAGNOSIS — T8131XS Disruption of external operation (surgical) wound, not elsewhere classified, sequela: Secondary | ICD-10-CM | POA: Diagnosis not present

## 2017-08-24 DIAGNOSIS — K229 Disease of esophagus, unspecified: Secondary | ICD-10-CM | POA: Diagnosis not present

## 2017-08-24 DIAGNOSIS — K589 Irritable bowel syndrome without diarrhea: Secondary | ICD-10-CM | POA: Insufficient documentation

## 2017-08-24 DIAGNOSIS — K6389 Other specified diseases of intestine: Secondary | ICD-10-CM | POA: Diagnosis not present

## 2017-09-28 DIAGNOSIS — N3000 Acute cystitis without hematuria: Secondary | ICD-10-CM | POA: Diagnosis not present

## 2017-09-28 DIAGNOSIS — Z6837 Body mass index (BMI) 37.0-37.9, adult: Secondary | ICD-10-CM | POA: Diagnosis not present

## 2017-09-28 DIAGNOSIS — M797 Fibromyalgia: Secondary | ICD-10-CM | POA: Diagnosis not present

## 2017-09-28 DIAGNOSIS — G4733 Obstructive sleep apnea (adult) (pediatric): Secondary | ICD-10-CM | POA: Diagnosis not present

## 2017-09-28 DIAGNOSIS — I2510 Atherosclerotic heart disease of native coronary artery without angina pectoris: Secondary | ICD-10-CM | POA: Diagnosis not present

## 2017-09-28 DIAGNOSIS — I119 Hypertensive heart disease without heart failure: Secondary | ICD-10-CM | POA: Diagnosis not present

## 2017-09-28 DIAGNOSIS — E782 Mixed hyperlipidemia: Secondary | ICD-10-CM | POA: Diagnosis not present

## 2017-09-28 DIAGNOSIS — R7301 Impaired fasting glucose: Secondary | ICD-10-CM | POA: Diagnosis not present

## 2017-10-12 ENCOUNTER — Other Ambulatory Visit: Payer: Self-pay | Admitting: Cardiology

## 2017-10-12 NOTE — Telephone Encounter (Signed)
Rx has been sent to the pharmacy electronically. ° °

## 2017-12-09 DIAGNOSIS — M79641 Pain in right hand: Secondary | ICD-10-CM | POA: Diagnosis not present

## 2017-12-09 DIAGNOSIS — M79642 Pain in left hand: Secondary | ICD-10-CM | POA: Diagnosis not present

## 2017-12-24 ENCOUNTER — Other Ambulatory Visit: Payer: Self-pay

## 2018-01-04 DIAGNOSIS — K760 Fatty (change of) liver, not elsewhere classified: Secondary | ICD-10-CM | POA: Diagnosis not present

## 2018-01-08 ENCOUNTER — Other Ambulatory Visit: Payer: Self-pay | Admitting: Cardiology

## 2018-01-13 DIAGNOSIS — K760 Fatty (change of) liver, not elsewhere classified: Secondary | ICD-10-CM | POA: Diagnosis not present

## 2018-03-09 DIAGNOSIS — Z23 Encounter for immunization: Secondary | ICD-10-CM | POA: Diagnosis not present

## 2018-03-12 ENCOUNTER — Encounter: Payer: Self-pay | Admitting: Cardiology

## 2018-03-30 ENCOUNTER — Telehealth: Payer: Self-pay | Admitting: Gastroenterology

## 2018-03-30 DIAGNOSIS — J028 Acute pharyngitis due to other specified organisms: Secondary | ICD-10-CM | POA: Diagnosis not present

## 2018-03-30 DIAGNOSIS — R197 Diarrhea, unspecified: Secondary | ICD-10-CM | POA: Diagnosis not present

## 2018-03-30 NOTE — Telephone Encounter (Signed)
Patient states she is having lose stool and wanting to get her next colon with Dr.Gupte. Patient states she was previously seen by Dr.Gupta but then was referred to [facility]. Patient now wants to come back to Dr.Gupta. Please review records in care everywhere and advise on scheduling.

## 2018-03-30 NOTE — Progress Notes (Signed)
Cardiology Office Note   Date:  04/01/2018   ID:  Jessica Gould, DOB Jun 29, 1945, MRN [id number] PCP:  Rochel Brome, MD  Cardiologist:   Minus Breeding, MD    Chief Complaint  Patient presents with  . Shortness of Breath      History of Present Illness: Jessica Gould is a 72 y.o. female who presents for evaluation after PCI. In 2017 she had a cardiac cath at 2017.  She was found to have 30% LAD stenosis. The circumflex had 95-99% proximal stenosis. The right coronary artery 30% stenosis. The EF 70%. She had DES stenting. She did have a small groin hematoma complicating this.  Since I last saw her she had a Heller myotomy and fundoplication.  She is done well.  She does have some dyspnea climbing stairs.  This is been relatively new.  She noticed this recently when she was in [facility].  She is not describing chest discomfort, neck or arm discomfort.  She has not been having PND or orthopnea.  She does have occasional palpitations.  This happens about 4 times every couple of months.  She will describe her pulse beating rapidly for 5 to 10 minutes.  She does not have presyncope or syncope.  Is not associated with pain.  Past Medical History:  Diagnosis Date  . Arthritis   . CAD (coronary artery disease)   . GERD (gastroesophageal reflux disease)   . Headache(784.0)   . PONV (postoperative nausea and vomiting)    BP dropprd once  . Sleep apnea    On CPAP    Past Surgical History:  Procedure Laterality Date  . ABDOMINAL HYSTERECTOMY    . APPENDECTOMY    . BACK SURGERY  2004   lumbar  . BREAST BIOPSY Left 06/23/2006  . BREAST BIOPSY Left 04/12/2003  . BREAST EXCISIONAL BIOPSY Left 07/13/2006  . BREAST EXCISIONAL BIOPSY Left 2004  . CHOLECYSTECTOMY    . DIAGNOSTIC LAPAROSCOPY    . ESOPHAGEAL DILATION  03/31/2013  . ESOPHAGOGASTRIC FUNDOPLICATION    . HARDWARE REMOVAL N/A 04/04/2013   Procedure: HARDWARE REMOVAL;  Surgeon: Jessy Oto, MD;  Location: [facility];   Service: Orthopedics;  Laterality: N/A;  . HELLER MYOTOMY    . LUMBAR LAMINECTOMY N/A 04/04/2013   Procedure: Left L3-4 lateral recess decompression and Left L3-4 foraminotomy, Removal of Rod and Screws Left L4-5;  Surgeon: Jessy Oto, MD;  Location: [facility];  Service: Orthopedics;  Laterality: N/A;  . TONSILLECTOMY Right    nodals removed  . TUBAL LIGATION    . UPPER GI ENDOSCOPY       Current Outpatient Medications  Medication Sig Dispense Refill  . amLODipine (NORVASC) 2.5 MG tablet TAKE ONE TABLET BY MOUTH EVERY DAY 90 tablet 0  . aspirin EC 81 MG tablet Take 1 tablet (81 mg total) by mouth daily. 90 tablet 3  . methocarbamol (ROBAXIN) 500 MG tablet Take 1 tablet (500 mg total) by mouth 4 (four) times daily. 40 tablet 1  . metoprolol succinate (TOPROL-XL) 100 MG 24 hr tablet TAKE ONE TABLET BY MOUTH DAILY 90 tablet 0  . rosuvastatin (CRESTOR) 20 MG tablet TAKE ONE TABLET BY MOUTH EVERY DAY 90 tablet 0  . sertraline (ZOLOFT) 50 MG tablet Take 1 tablet (50 mg total) by mouth daily.    . traMADol (ULTRAM) 50 MG tablet Take 1 tablet (50 mg total) by mouth every 6 (six) hours as needed. 40 tablet 0  . nitroGLYCERIN (  NITROSTAT) 0.4 MG SL tablet Place 1 tablet (0.4 mg total) under the tongue every 5 (five) minutes as needed for chest pain. 25 tablet 0   No current facility-administered medications for this visit.     Allergies:   Sulfamethoxazole-trimethoprim; Statins; Duloxetine; Other; Oxycontin [oxycodone hcl]; Erythromycin; Penicillins; and Phenobarbital    ROS:  Please see the history of present illness.   Otherwise, review of systems are positive for back pain.   All other systems are reviewed and negative.    PHYSICAL EXAM: VS:  BP 134/80 (BP Location: Right Arm, Patient Position: Sitting, Cuff Size: Large)   Pulse (!) 58   Ht 5\' 1"  (1.549 m)   Wt 200 lb (90.7 kg)   BMI 37.79 kg/m  , BMI Body mass index is 37.79 kg/m.  GENERAL:  Well appearing NECK:  No jugular venous  distention, waveform within normal limits, carotid upstroke brisk and symmetric, no bruits, no thyromegaly LUNGS:  Clear to auscultation bilaterally CHEST:  Unremarkable HEART:  PMI not displaced or sustained,S1 and S2 within normal limits, no S3, no S4, no clicks, no rubs, no murmurs ABD:  Flat, positive bowel sounds normal in frequency in pitch, no bruits, no rebound, no guarding, no midline pulsatile mass, no hepatomegaly, no splenomegaly well-healed abdominal scars. EXT:  2 plus pulses throughout, no edema, no cyanosis no clubbing    EKG:  EKG is  ordered today. Sinus rhythm, rate 58, axis within normal limits, intervals normal limits, no acute ST-T wave changes.  Recent Labs: No results found for requested labs within last 8760 hours.    Lipid Panel No results found for: CHOL, TRIG, HDL, CHOLHDL, VLDL, LDLCALC, LDLDIRECT    Wt Readings from Last 3 Encounters:  04/01/18 200 lb (90.7 kg)  08/04/17 196 lb (88.9 kg)  06/25/17 202 lb (91.6 kg)      Other studies Reviewed: Additional studies/ records that were reviewed today include: [facility] records for her recent GI surgical procedure Review of the above records demonstrates:   Updated PSH   ASSESSMENT AND PLAN:  CAD:   Given the shortness of breath I think she does need to be screened with POET (Plain Old Exercise Treadmill)  DYSLIPIDEMIA:    Her LDL has been slightly elevated.  I have asked her to have this repeated at her primary provider's office.  For now she will continue the meds as listed.   HTN:  The blood pressure is at target.  No change in therapy.   PALPITATIONS: We talked about different ways of monitoring.  She is most interested in getting an Alive Cor.  She will let me know if her symptoms worsen.  Current medicines are reviewed at length with the patient today.  The patient does not have concerns regarding medicines.  The following changes have been made:  None  Labs/ tests ordered today include:   Orders  Placed This Encounter  Procedures  . EXERCISE TOLERANCE TEST (ETT)  . EKG 12-Lead     Disposition:   FU with me in  12  months    Signed, Minus Breeding, MD  04/01/2018 10:40 AM    [place]

## 2018-03-31 NOTE — Telephone Encounter (Signed)
Can have appt with me (in 2-3 weeks) Also, needs to continue to FU with [facility] for liver

## 2018-04-01 ENCOUNTER — Encounter: Payer: Self-pay | Admitting: Cardiology

## 2018-04-01 ENCOUNTER — Ambulatory Visit (INDEPENDENT_AMBULATORY_CARE_PROVIDER_SITE_OTHER): Payer: Medicare Other | Admitting: Cardiology

## 2018-04-01 VITALS — BP 134/80 | HR 58 | Ht 61.0 in | Wt 200.0 lb

## 2018-04-01 DIAGNOSIS — I2510 Atherosclerotic heart disease of native coronary artery without angina pectoris: Secondary | ICD-10-CM | POA: Insufficient documentation

## 2018-04-01 DIAGNOSIS — R002 Palpitations: Secondary | ICD-10-CM | POA: Insufficient documentation

## 2018-04-01 DIAGNOSIS — I25119 Atherosclerotic heart disease of native coronary artery with unspecified angina pectoris: Secondary | ICD-10-CM | POA: Diagnosis not present

## 2018-04-01 DIAGNOSIS — E785 Hyperlipidemia, unspecified: Secondary | ICD-10-CM | POA: Diagnosis not present

## 2018-04-01 DIAGNOSIS — R0602 Shortness of breath: Secondary | ICD-10-CM | POA: Diagnosis not present

## 2018-04-01 NOTE — Patient Instructions (Addendum)
Medication Instructions:  Continue current medications  If you need a refill on your cardiac medications before your next appointment, please call your pharmacy.  Labwork: None Ordered   If you have labs (blood work) drawn today and your tests are completely normal, you will receive your results only by: Marland Kitchen MyChart Message (if you have MyChart) OR . A paper copy in the mail If you have any lab test that is abnormal or we need to change your treatment, we will call you to review the results.  Testing/Procedures: Your physician has requested that you have an exercise tolerance test. For further information please visit [email or web address]. Please also follow instruction sheet, as given.  Follow-Up: You will need a follow up appointment in 1 Year.  Please call our office 2 months in advance([phone]) to schedule the (1 Year) appointment.  You may see  DR Percival Spanish, or one of the following Advanced Practice Providers on your designated Care Team:    . Jory Sims, DNP, ANP . Rhonda Barrett, PA-C  . Kerin Ransom, [place]  . Almyra Deforest, PA-C . Fabian Sharp, PA-C  At [facility], you and your health needs are our priority.  As part of our continuing mission to provide you with exceptional heart care, we have created designated Provider Care Teams.  These Care Teams include your primary Cardiologist (physician) and Advanced Practice Providers (APPs -  Physician Assistants and Nurse Practitioners) who all work together to provide you with the care you need, when you need it.   Thank you for choosing [facility] at [facility]!!     ALIVECOR

## 2018-04-02 DIAGNOSIS — E782 Mixed hyperlipidemia: Secondary | ICD-10-CM | POA: Diagnosis not present

## 2018-04-02 DIAGNOSIS — R7301 Impaired fasting glucose: Secondary | ICD-10-CM | POA: Diagnosis not present

## 2018-04-02 DIAGNOSIS — I119 Hypertensive heart disease without heart failure: Secondary | ICD-10-CM | POA: Diagnosis not present

## 2018-04-08 ENCOUNTER — Telehealth (HOSPITAL_COMMUNITY): Payer: Self-pay

## 2018-04-08 DIAGNOSIS — Z6838 Body mass index (BMI) 38.0-38.9, adult: Secondary | ICD-10-CM | POA: Diagnosis not present

## 2018-04-08 DIAGNOSIS — I119 Hypertensive heart disease without heart failure: Secondary | ICD-10-CM | POA: Diagnosis not present

## 2018-04-08 DIAGNOSIS — E782 Mixed hyperlipidemia: Secondary | ICD-10-CM | POA: Diagnosis not present

## 2018-04-08 DIAGNOSIS — K580 Irritable bowel syndrome with diarrhea: Secondary | ICD-10-CM | POA: Diagnosis not present

## 2018-04-08 DIAGNOSIS — R7301 Impaired fasting glucose: Secondary | ICD-10-CM | POA: Diagnosis not present

## 2018-04-08 NOTE — Telephone Encounter (Signed)
Encounter complete. 

## 2018-04-09 ENCOUNTER — Encounter: Payer: Self-pay | Admitting: Gastroenterology

## 2018-04-09 NOTE — Telephone Encounter (Signed)
Patient scheduled for ov on 12.2.19.

## 2018-04-13 ENCOUNTER — Ambulatory Visit (HOSPITAL_COMMUNITY)
Admission: RE | Admit: 2018-04-13 | Discharge: 2018-04-13 | Disposition: A | Discharge status: Home / Self Care | Payer: Medicare Other | Source: Ambulatory Visit | Attending: Cardiovascular Disease | Admitting: Cardiovascular Disease

## 2018-04-13 DIAGNOSIS — R0602 Shortness of breath: Secondary | ICD-10-CM | POA: Diagnosis not present

## 2018-04-13 LAB — EXERCISE TOLERANCE TEST
CSEPEW: 6.3 METS
Exercise duration (min): 4 min
Exercise duration (sec): 27 s
MPHR: 146 {beats}/min
Peak HR: 122 {beats}/min
Percent HR: 83 %
RPE: 19
Rest HR: 57 {beats}/min

## 2018-04-15 DIAGNOSIS — M79641 Pain in right hand: Secondary | ICD-10-CM | POA: Diagnosis not present

## 2018-04-26 ENCOUNTER — Ambulatory Visit (INDEPENDENT_AMBULATORY_CARE_PROVIDER_SITE_OTHER): Payer: Medicare Other | Admitting: Gastroenterology

## 2018-04-26 ENCOUNTER — Encounter: Payer: Self-pay | Admitting: Gastroenterology

## 2018-04-26 VITALS — BP 150/88 | HR 55 | Ht 61.0 in | Wt 200.2 lb

## 2018-04-26 DIAGNOSIS — K580 Irritable bowel syndrome with diarrhea: Secondary | ICD-10-CM

## 2018-04-26 DIAGNOSIS — R1011 Right upper quadrant pain: Secondary | ICD-10-CM

## 2018-04-26 DIAGNOSIS — G8929 Other chronic pain: Secondary | ICD-10-CM | POA: Diagnosis not present

## 2018-04-26 MED ORDER — SUPREP BOWEL PREP KIT 17.5-3.13-1.6 GM/177ML PO SOLN: 1.0000 | ORAL | 0 refills | Status: DC

## 2018-04-26 MED ORDER — DICYCLOMINE HCL 10 MG PO CAPS: ORAL_CAPSULE | ORAL | 3 refills | Status: DC

## 2018-04-26 NOTE — Progress Notes (Signed)
Chief Complaint: Abdominal pain  Referring Provider:  Rochel Brome, MD      ASSESSMENT AND PLAN;   #1. RUQ pain with change in bowel habits. Neg CT except for fatty liver 2017, s/p chole in past. Neg EGD 03/2017 at [facility]. #2. 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 NAFLD  Plan: - Proceed with colonoscopy.  I have discussed the risks and benefits.  The risks including risk of perforation requiring laparotomy, bleeding after polypectomy requiring blood transfusions and risks of anesthesia/sedation were discussed.  Rare risks of missing colorectal neoplasms were also discussed.  Alternatives were given.  Patient is fully aware and agrees to proceed. All the questions were answered. Colonoscopy will be scheduled in upcoming days.  Patient is to report immediately if there is any significant weight loss or excessive bleeding until then. Consent forms were given for review. - Bentyl 10mg  po bid. if still with problems, will give her a trial of cholestyramine. - I discussed in detail regarding weight loss.  Stop eating fried foods, stop sodas and start walking/exercising.    HPI:    Jessica Gould is a 72 y.o. female  With chronic right upper quadrant abdominal pain x 15 to 20 years Which gets partially relieved on defecation She has been having " dumping" 3-4 times per week, ever since her gallbladder surgery. Has been advised to get colonoscopy performed. Denies having any melena or hematochezia. He does give history of polyps as detailed below. No significant family history of colon cancer or colonic polyps.  Denies having any further dysphagia ever since she had Heller's myotomy with fundoplication for jackhammer esophagus.  She is off Protonix.  No significant weight loss.  She has been trying to lose  weight ever since she has been diagnosed with fatty liver.  Of note that she had normal liver function tests.  Additional past GI history: -Negative small bowel biopsies for celiac 11/2015. -Manometry at [facility] was reviewed -Colonoscopy 06/2010 ([facility])-rectal polyp status post polypectomy, mild sigmoid diverticulosis. Past Medical History:  Diagnosis Date  . Arthritis   . CAD (coronary artery disease)   . GERD (gastroesophageal reflux disease)   . Headache(784.0)   . PONV (postoperative nausea and vomiting)    BP dropprd once  . Sleep apnea    On CPAP    Past Surgical History:  Procedure Laterality Date  . ABDOMINAL HYSTERECTOMY    . APPENDECTOMY    . BACK SURGERY  2004   lumbar  . BREAST BIOPSY Left 06/23/2006  . BREAST BIOPSY Left 04/12/2003  . BREAST EXCISIONAL BIOPSY Left 07/13/2006  . BREAST EXCISIONAL BIOPSY Left 2004  . CHOLECYSTECTOMY    . COLONOSCOPY  07/15/2010   Rectal Polyp, mild sigmoid diverticulosis, small internal hemorrhoids. BX: hyperplastic polyp.  Marland Kitchen DIAGNOSTIC LAPAROSCOPY    . ESOPHAGEAL DILATION  03/31/2013  . ESOPHAGOGASTRIC FUNDOPLICATION    . ESOPHAGOGASTRODUODENOSCOPY  07/15/2010   Schatzki ring, hiatal hernia, presbyesophagus   . HARDWARE REMOVAL N/A 04/04/2013   Procedure: HARDWARE REMOVAL;  Surgeon: Jessy Oto, MD;  Location: [place];  Service: Orthopedics;  Laterality: N/A;  . HELLER MYOTOMY    . LUMBAR LAMINECTOMY N/A 04/04/2013   Procedure: Left L3-4 lateral recess decompression and Left L3-4 foraminotomy, Removal of Rod and Screws Left L4-5;  Surgeon: Jessy Oto, MD;  Location: [place];  Service: Orthopedics;  Laterality: N/A;  . TONSILLECTOMY Right    nodals removed  . TUBAL LIGATION    . UPPER GI ENDOSCOPY      Family History  Problem Relation Age of Onset  . Heart disease Father 29  . Rheumatologic disease Father   . Kidney cancer Father   . Stomach cancer Mother   . Heart disease Brother 14  . Heart disease Sister 79  .  Rheumatologic disease Paternal Grandmother   . Colon cancer Neg Hx     Social History   Tobacco Use  . Smoking status: Never Smoker  . Smokeless tobacco: Never Used  Substance Use Topics  . Alcohol use: No    Alcohol/week: 0.0 standard drinks  . Drug use: No    Current Outpatient Medications  Medication Sig Dispense Refill  . amLODipine (NORVASC) 2.5 MG tablet TAKE ONE TABLET BY MOUTH EVERY DAY 90 tablet 0  . metoprolol succinate (TOPROL-XL) 100 MG 24 hr tablet TAKE ONE TABLET BY MOUTH DAILY 90 tablet 0  . rosuvastatin (CRESTOR) 20 MG tablet TAKE ONE TABLET BY MOUTH EVERY DAY 90 tablet 0  . sertraline (ZOLOFT) 50 MG tablet Take 1 tablet (50 mg total) by mouth daily.    . nitroGLYCERIN (NITROSTAT) 0.4 MG SL tablet Place 1 tablet (0.4 mg total) under the tongue every 5 (five) minutes as needed for chest pain. 25 tablet 0   No current facility-administered medications for this visit.     Allergies  Allergen Reactions  . Sulfamethoxazole-Trimethoprim Swelling, Anaphylaxis and Rash    lips lips  . Statins Other (See Comments)    Other reaction(s): Other (See Comments) Body aches Nausea Body aches Nausea Body aches Nausea  . Augmentin [Amoxicillin-Pot Clavulanate]     Upset stomach   . Clindamycin/Lincomycin     Upset stomach  . Doxycycline     abd pain   . Duloxetine     Cymbalta= unknown Cymbalta= unknown  . Other     "Cat Gut Sutures"= abscess  . Oxycontin [Oxycodone Hcl]     migraine headache  . Erythromycin Nausea And Vomiting    Abdominal pain Abdominal pain  . Penicillins Itching and Rash  . Phenobarbital Rash    Review of Systems:  Constitutional: Denies fever, chills, diaphoresis, appetite change and fatigue.  HEENT: Denies photophobia, eye pain, redness, hearing loss, ear pain, congestion, sore throat, rhinorrhea, sneezing, mouth sores, neck pain, neck stiffness and tinnitus.   Respiratory: Denies SOB, DOE, cough, chest tightness,  and wheezing.     Cardiovascular: Denies chest pain, palpitations and leg swelling.  Genitourinary: Denies dysuria, urgency, frequency, hematuria, flank pain and difficulty urinating.  Musculoskeletal: Denies myalgias, back pain, joint swelling, arthralgias and gait problem.  Skin: No rash.  Neurological: Denies dizziness, seizures, syncope, weakness, light-headedness, numbness and headaches.  Hematological: Denies adenopathy. Easy bruising, personal or family bleeding history  Psychiatric/Behavioral: Has anxiety or depression     Physical Exam:    BP (!) 150/88   Pulse (!) 55   Ht 5\' 1"  (1.549 m)   Wt 200 lb 4 oz (90.8 kg)   SpO2 96%   BMI 37.84 kg/m  Filed Weights   04/26/18 1418  Weight: 200 lb 4 oz (90.8 kg)   Constitutional:  Well-developed, in no acute distress. Psychiatric: Normal mood and affect. Behavior is normal. HEENT: Pupils normal.  Conjunctivae are normal. No scleral icterus. Neck supple.  Cardiovascular: Normal rate, regular rhythm. No edema Pulmonary/chest: Effort normal and breath  sounds normal. No wheezing, rales or rhonchi. Abdominal: Soft, nondistended. Nontender. Bowel sounds active throughout. There are no masses palpable. No hepatomegaly. Rectal:  defered Neurological: Alert and oriented to person place and time. Skin: Skin is warm and dry. No rashes noted.  Data Reviewed: I have personally reviewed following labs and imaging studies  CBC: CBC Latest Ref Rng & Units 09/26/2016 03/31/2013  WBC 3.8 - 10.8 K/uL 6.9 8.8  Hemoglobin 11.7 - 15.5 g/dL 13.7 16.0(H)  Hematocrit 35.0 - 45.0 % 42.3 46.7(H)  Platelets 140 - 400 K/uL 206 238    CMP: CMP Latest Ref Rng & Units 09/26/2016 03/31/2013  Glucose 65 - 99 mg/dL 87 94  BUN 7 - 25 mg/dL 19 16  Creatinine 0.60 - 0.93 mg/dL 1.03(H) 0.78  Sodium 135 - 146 mmol/L 141 140  Potassium 3.5 - 5.3 mmol/L 4.4 3.7  Chloride 98 - 110 mmol/L 106 101  CO2 20 - 31 mmol/L 23 27  Calcium 8.6 - 10.4 mg/dL 9.5 9.6  Total Protein 6.1 -  8.1 g/dL 6.4 7.1  Total Bilirubin 0.2 - 1.2 mg/dL 0.4 0.3  Alkaline Phos 33 - 130 U/L 93 102  AST 10 - 35 U/L 23 20  ALT 6 - 29 U/L 20 22  25  minutes spent with the patient today. Greater than 50% was spent in counseling and coordination of care with the patient    Carmell Austria, MD 04/26/2018, 2:35 PM  Cc: Rochel Brome, MD

## 2018-04-26 NOTE — Patient Instructions (Signed)
If you are age 74 or older, your body mass index should be between 23-30. Your Body mass index is 37.84 kg/m. If this is out of the aforementioned range listed, please consider follow up with your Primary Care Provider.  If you are age 44 or younger, your body mass index should be between 19-25. Your Body mass index is 37.84 kg/m. If this is out of the aformentioned range listed, please consider follow up with your Primary Care Provider.   You have been scheduled for a colonoscopy. Please follow written instructions given to you at your visit today.  Please pick up your prep supplies at the pharmacy within the next 1-3 days. If you use inhalers (even only as needed), please bring them with you on the day of your procedure. Your physician has requested that you go to www.startemmi.com and enter the access code given to you at your visit today. This web site gives a general overview about your procedure. However, you should still follow specific instructions given to you by our office regarding your preparation for the procedure.  We have sent the following medications to your pharmacy for you to pick up at your convenience: Bentyl 10 mg twice daily   Thank you,  Dr. Jackquline Denmark

## 2018-04-27 ENCOUNTER — Telehealth: Payer: Self-pay | Admitting: Gastroenterology

## 2018-04-27 ENCOUNTER — Other Ambulatory Visit: Payer: Self-pay | Admitting: Family Medicine

## 2018-04-27 DIAGNOSIS — Z1231 Encounter for screening mammogram for malignant neoplasm of breast: Secondary | ICD-10-CM

## 2018-04-27 NOTE — Telephone Encounter (Signed)
Patient is going to come by the office to pick up the sample.

## 2018-04-28 ENCOUNTER — Ambulatory Visit
Admission: RE | Admit: 2018-04-28 | Discharge: 2018-04-28 | Disposition: A | Discharge status: Home / Self Care | Payer: Medicare Other | Source: Ambulatory Visit | Attending: Family Medicine | Admitting: Family Medicine

## 2018-04-28 DIAGNOSIS — Z1231 Encounter for screening mammogram for malignant neoplasm of breast: Secondary | ICD-10-CM

## 2018-05-24 ENCOUNTER — Telehealth: Payer: Self-pay | Admitting: Cardiology

## 2018-05-24 MED ORDER — METOPROLOL SUCCINATE ER 100 MG PO TB24: 100.0000 mg | ORAL_TABLET | Freq: Every day | ORAL | 0 refills | Status: DC

## 2018-05-24 NOTE — Telephone Encounter (Signed)
rx sent to pharmacy

## 2018-05-24 NOTE — Telephone Encounter (Signed)
New Message         *STAT* If patient is at the pharmacy, call can be transferred to refill team.   1. Which medications need to be refilled? (please list name of each medication and dose if known) Metoprolol Succinate  ER  2. Which pharmacy/location (including street and city if local pharmacy) is medication to be sent to?[facility] [phone] Fax [phone] spoke [place] 3. Do they need a 30 day or 90 day supply? 30/ Patient is completely

## 2018-05-26 ENCOUNTER — Other Ambulatory Visit: Payer: Self-pay | Admitting: Cardiology

## 2018-06-03 ENCOUNTER — Encounter: Payer: Self-pay | Admitting: Gastroenterology

## 2018-06-03 ENCOUNTER — Ambulatory Visit (AMBULATORY_SURGERY_CENTER): Payer: Medicare Other | Admitting: Gastroenterology

## 2018-06-03 VITALS — BP 163/70 | HR 49 | Temp 97.1°F | Resp 13 | Ht 61.0 in | Wt 200.0 lb

## 2018-06-03 DIAGNOSIS — K5730 Diverticulosis of large intestine without perforation or abscess without bleeding: Secondary | ICD-10-CM

## 2018-06-03 DIAGNOSIS — D179 Benign lipomatous neoplasm, unspecified: Secondary | ICD-10-CM | POA: Diagnosis not present

## 2018-06-03 DIAGNOSIS — Z8673 Personal history of transient ischemic attack (TIA), and cerebral infarction without residual deficits: Secondary | ICD-10-CM | POA: Diagnosis not present

## 2018-06-03 DIAGNOSIS — D127 Benign neoplasm of rectosigmoid junction: Secondary | ICD-10-CM

## 2018-06-03 DIAGNOSIS — D129 Benign neoplasm of anus and anal canal: Secondary | ICD-10-CM

## 2018-06-03 DIAGNOSIS — D128 Benign neoplasm of rectum: Secondary | ICD-10-CM

## 2018-06-03 DIAGNOSIS — K635 Polyp of colon: Secondary | ICD-10-CM | POA: Diagnosis not present

## 2018-06-03 DIAGNOSIS — R1011 Right upper quadrant pain: Secondary | ICD-10-CM | POA: Diagnosis not present

## 2018-06-03 DIAGNOSIS — K621 Rectal polyp: Secondary | ICD-10-CM | POA: Diagnosis not present

## 2018-06-03 DIAGNOSIS — I10 Essential (primary) hypertension: Secondary | ICD-10-CM | POA: Diagnosis not present

## 2018-06-03 DIAGNOSIS — E785 Hyperlipidemia, unspecified: Secondary | ICD-10-CM | POA: Diagnosis not present

## 2018-06-03 DIAGNOSIS — I2510 Atherosclerotic heart disease of native coronary artery without angina pectoris: Secondary | ICD-10-CM | POA: Diagnosis not present

## 2018-06-03 DIAGNOSIS — D123 Benign neoplasm of transverse colon: Secondary | ICD-10-CM

## 2018-06-03 DIAGNOSIS — R197 Diarrhea, unspecified: Secondary | ICD-10-CM

## 2018-06-03 DIAGNOSIS — K580 Irritable bowel syndrome with diarrhea: Secondary | ICD-10-CM

## 2018-06-03 DIAGNOSIS — G4733 Obstructive sleep apnea (adult) (pediatric): Secondary | ICD-10-CM | POA: Diagnosis not present

## 2018-06-03 DIAGNOSIS — K589 Irritable bowel syndrome without diarrhea: Secondary | ICD-10-CM | POA: Diagnosis not present

## 2018-06-03 MED ORDER — SODIUM CHLORIDE 0.9 % IV SOLN: 500.0000 mL | Freq: Once | INTRAVENOUS | Status: DC

## 2018-06-03 MED ORDER — TRAMADOL HCL 50 MG PO TABS: 50.0000 mg | ORAL_TABLET | Freq: Three times a day (TID) | ORAL | 0 refills | Status: DC | PRN

## 2018-06-03 NOTE — Progress Notes (Signed)
Called to room to assist during endoscopic procedure.  Patient ID and intended procedure confirmed with present staff. Received instructions for my participation in the procedure from the performing physician.  

## 2018-06-03 NOTE — Progress Notes (Signed)
Pt's states no medical or surgical changes since previsit or office visit. 

## 2018-06-03 NOTE — Patient Instructions (Signed)
Handouts given for polyps, diverticulosis and hemorrhoids.  YOU HAD AN ENDOSCOPIC PROCEDURE TODAY AT THE [facility]:   Refer to the procedure report that was given to you for any specific questions about what was found during the examination.  If the procedure report does not answer your questions, please call your gastroenterologist to clarify.  If you requested that your care partner not be given the details of your procedure findings, then the procedure report has been included in a sealed envelope for you to review at your convenience later.  YOU SHOULD EXPECT: Some feelings of bloating in the abdomen. Passage of more gas than usual.  Walking can help get rid of the air that was put into your GI tract during the procedure and reduce the bloating. If you had a lower endoscopy (such as a colonoscopy or flexible sigmoidoscopy) you may notice spotting of blood in your stool or on the toilet paper. If you underwent a bowel prep for your procedure, you may not have a normal bowel movement for a few days.  Please Note:  You might notice some irritation and congestion in your nose or some drainage.  This is from the oxygen used during your procedure.  There is no need for concern and it should clear up in a day or so.  SYMPTOMS TO REPORT IMMEDIATELY:   Following lower endoscopy (colonoscopy or flexible sigmoidoscopy):  Excessive amounts of blood in the stool  Significant tenderness or worsening of abdominal pains  Swelling of the abdomen that is new, acute  Fever of 100F or higher  For urgent or emergent issues, a gastroenterologist can be reached at any hour by calling [phone].   DIET:  We do recommend a small meal at first, but then you may proceed to your regular diet.  Drink plenty of fluids but you should avoid alcoholic beverages for 24 hours.  ACTIVITY:  You should plan to take it easy for the rest of today and you should NOT DRIVE or use heavy machinery until tomorrow  (because of the sedation medicines used during the test).    FOLLOW UP: Our staff will call the number listed on your records the next business day following your procedure to check on you and address any questions or concerns that you may have regarding the information given to you following your procedure. If we do not reach you, we will leave a message.  However, if you are feeling well and you are not experiencing any problems, there is no need to return our call.  We will assume that you have returned to your regular daily activities without incident.  If any biopsies were taken you will be contacted by phone or by letter within the next 1-3 weeks.  Please call us at [phone] if you have not heard about the biopsies in 3 weeks.    SIGNATURES/CONFIDENTIALITY: You and/or your care partner have signed paperwork which will be entered into your electronic medical record.  These signatures attest to the fact that that the information above on your After Visit Summary has been reviewed and is understood.  Full responsibility of the confidentiality of this discharge information lies with you and/or your care-partner. 

## 2018-06-03 NOTE — Op Note (Addendum)
[place] Patient Name: Jessica Gould Procedure Date: 06/03/2018 10:29 AM MRN: [id number] Endoscopist: Jackquline Denmark , MD Age: 73 Referring MD:  Date of Birth: 1946/05/06 Gender: Female Account #: [id number] Procedure:                Colonoscopy Indications:              Chronic diarrhea, history of polyps. Medicines:                Monitored Anesthesia Care Procedure:                Pre-Anesthesia Assessment:                           - Prior to the procedure, a History and Physical                            was performed, and patient medications and                            allergies were reviewed. The patient's tolerance of                            previous anesthesia was also reviewed. The risks                            and benefits of the procedure and the sedation                            options and risks were discussed with the patient.                            All questions were answered, and informed consent                            was obtained. Prior Anticoagulants: The patient has                            taken no previous anticoagulant or antiplatelet                            agents. ASA Grade Assessment: II - A patient with                            mild systemic disease. After reviewing the risks                            and benefits, the patient was deemed in                            satisfactory condition to undergo the procedure.                           After obtaining informed consent, the colonoscope  was passed under direct vision. Throughout the                            procedure, the patient's blood pressure, pulse, and                            oxygen saturations were monitored continuously. The                            Colonoscope was introduced through the anus and                            advanced to the 3 cm into the ileum. The                            colonoscopy was performed without  difficulty. The                            patient tolerated the procedure well. The quality                            of the bowel preparation was good. Scope In: 10:33:14 AM Scope Out: 10:51:02 AM Scope Withdrawal Time: 0 hours 14 minutes 21 seconds  Total Procedure Duration: 0 hours 17 minutes 48 seconds  Findings:                 Four sessile polyps were found in the rectum,                            recto-sigmoid colon, proximal transverse colon and                            mid transverse colon. The polyps were 4 to 8 mm in                            size. These polyps were removed with a cold snare.                            Resection and retrieval were complete. Incidental                            note was made of a 1 cm lipoma in the ascending                            colon. Biopsies for histology were taken with a                            cold forceps for evaluation of microscopic colitis.                           Multiple small-mouthed diverticula were found in  the sigmoid colon.                           Non-bleeding internal hemorrhoids were found during                            retroflexion. The hemorrhoids were small.                           The terminal ileum appeared normal. Biopsies were                            taken with a cold forceps for histology.                           The exam was otherwise without abnormality on                            direct and retroflexion views. Complications:            No immediate complications. Estimated Blood Loss:     Estimated blood loss: none. Impression:               -Colonic Polyps status post polypectomy.                           -Moderate sigmoid diverticulosis.                           -Otherwise normal colonoscopy to TI. Recommendation:           - Patient has a contact number available for                            emergencies. The signs and symptoms of potential                             delayed complications were discussed with the                            patient. Return to normal activities tomorrow.                            Written discharge instructions were provided to the                            patient.                           - Resume previous diet.                           - Continue present medications.                           - Await pathology results.                           -  Repeat colonoscopy for surveillance based on                            pathology results.                           - Return to GI clinic in 12 weeks.                           - Given Ultram 50 mg p.o. every 8 hours as needed                            for abdominal pain #60, no refills. Jackquline Denmark, MD 06/03/2018 10:58:03 AM This report has been signed electronically.

## 2018-06-03 NOTE — Progress Notes (Signed)
Report to PACU, RN, vss, BBS= Clear.  

## 2018-06-04 ENCOUNTER — Telehealth: Payer: Self-pay | Admitting: *Deleted

## 2018-06-04 NOTE — Telephone Encounter (Signed)
  Follow up Call-  Call back number 06/03/2018  Post procedure Call Back phone  # [phone]  Permission to leave phone message Yes  Some recent data might be hidden     Patient questions:  Do you have a fever, pain , or abdominal swelling? No. Pain Score  0 *  Have you tolerated food without any problems? Yes.    Have you been able to return to your normal activities? Yes.    Do you have any questions about your discharge instructions: Diet   No. Medications  No. Follow up visit  No.  Do you have questions or concerns about your Care? No.  Actions: * If pain score is 4 or above: No action needed, pain <4.

## 2018-06-14 ENCOUNTER — Telehealth: Payer: Self-pay

## 2018-06-14 NOTE — Telephone Encounter (Signed)
Patient called and said she is still having problems with diarrhea. She said the Bentyl was not working at all, she felt like it may be making it worse. Would you like to try anything else?

## 2018-06-15 MED ORDER — CHOLESTYRAMINE 4 G PO PACK: 4.0000 g | PACK | Freq: Every day | ORAL | 12 refills | Status: DC

## 2018-06-15 NOTE — Telephone Encounter (Signed)
Can stop Bentyl Try cholestyramine 4g to once a day, 2 hours before or after rest of the medications. Can use Imodium AD on as-needed basis tid Let us know how she does not 2 to 3 weeks

## 2018-06-15 NOTE — Telephone Encounter (Signed)
Sent prescription to patients pharmacy, patient was notified.

## 2018-06-17 ENCOUNTER — Encounter: Payer: Self-pay | Admitting: Gastroenterology

## 2018-06-19 DIAGNOSIS — Z7982 Long term (current) use of aspirin: Secondary | ICD-10-CM | POA: Diagnosis not present

## 2018-06-19 DIAGNOSIS — R1013 Epigastric pain: Secondary | ICD-10-CM | POA: Diagnosis not present

## 2018-06-19 DIAGNOSIS — E860 Dehydration: Secondary | ICD-10-CM | POA: Diagnosis not present

## 2018-06-19 DIAGNOSIS — R197 Diarrhea, unspecified: Secondary | ICD-10-CM | POA: Diagnosis not present

## 2018-06-19 DIAGNOSIS — K760 Fatty (change of) liver, not elsewhere classified: Secondary | ICD-10-CM | POA: Diagnosis not present

## 2018-06-19 DIAGNOSIS — Z79899 Other long term (current) drug therapy: Secondary | ICD-10-CM | POA: Diagnosis not present

## 2018-06-19 DIAGNOSIS — R112 Nausea with vomiting, unspecified: Secondary | ICD-10-CM | POA: Diagnosis not present

## 2018-06-19 DIAGNOSIS — I10 Essential (primary) hypertension: Secondary | ICD-10-CM | POA: Diagnosis not present

## 2018-06-19 DIAGNOSIS — K5730 Diverticulosis of large intestine without perforation or abscess without bleeding: Secondary | ICD-10-CM | POA: Diagnosis not present

## 2018-06-25 DIAGNOSIS — E860 Dehydration: Secondary | ICD-10-CM | POA: Diagnosis not present

## 2018-06-25 DIAGNOSIS — I700 Atherosclerosis of aorta: Secondary | ICD-10-CM | POA: Diagnosis not present

## 2018-06-25 DIAGNOSIS — R1111 Vomiting without nausea: Secondary | ICD-10-CM | POA: Diagnosis not present

## 2018-06-25 DIAGNOSIS — R1013 Epigastric pain: Secondary | ICD-10-CM | POA: Diagnosis not present

## 2018-06-25 DIAGNOSIS — R197 Diarrhea, unspecified: Secondary | ICD-10-CM | POA: Diagnosis not present

## 2018-07-02 ENCOUNTER — Ambulatory Visit (INDEPENDENT_AMBULATORY_CARE_PROVIDER_SITE_OTHER): Payer: Medicare Other | Admitting: Vascular Surgery

## 2018-07-02 ENCOUNTER — Other Ambulatory Visit: Payer: Self-pay

## 2018-07-02 ENCOUNTER — Encounter: Payer: Self-pay | Admitting: Vascular Surgery

## 2018-07-02 VITALS — BP 149/72 | HR 52 | Temp 98.6°F | Resp 20 | Ht 61.0 in | Wt 200.0 lb

## 2018-07-02 DIAGNOSIS — I771 Stricture of artery: Secondary | ICD-10-CM

## 2018-07-02 DIAGNOSIS — K551 Chronic vascular disorders of intestine: Secondary | ICD-10-CM

## 2018-07-02 NOTE — Progress Notes (Signed)
Patient ID: HALAH WHITESIDE, female   DOB: [birth year], 73 y.o.   MRN: [id number]  Reason for Consult: New Patient (Initial Visit) (seen in ER recently abnormal scan)   Referred by Rochel Brome, MD  Subjective:     HPI:  LUGENIA ASSEFA is a 73 y.o. 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  This was found on CT scan that was done when patient had a virus and was seen in the ER with significant abdominal pain.  This was performed because she has a history of fundoplication surgery.  On evaluation today she has no further abdominal pain.  She does say that she has right flank pain that is constant nonradiating and only mild in nature.  She has not had any recent weight loss.  She does not have pain with eating or food fear.  She does have chronic diarrhea which is been an issue since her fundoplication surgery but otherwise bowels are unchanged recently.  Risk factors for vascular disease include family and personal history of coronary disease.  Past Medical History:  Diagnosis Date  . Arthritis   . CAD (coronary artery disease)   . GERD (gastroesophageal reflux disease)   . Headache(784.0)   . PONV (postoperative nausea and vomiting)    BP dropprd once  . Sleep apnea    On CPAP   Family History  Problem Relation Age of Onset  . Heart disease Father 60  . Rheumatologic disease Father   . Kidney cancer Father   . Stomach cancer Mother   . Heart disease Brother 36  . Heart disease Sister 59  . Rheumatologic disease Paternal Grandmother   . Colon cancer Neg Hx    Past Surgical History:  Procedure Laterality Date  . ABDOMINAL HYSTERECTOMY    . APPENDECTOMY    . BACK SURGERY  2004   lumbar  . BREAST BIOPSY Left 06/23/2006  . BREAST BIOPSY Left 04/12/2003  . BREAST EXCISIONAL BIOPSY Left 07/13/2006  . BREAST EXCISIONAL BIOPSY Left 2004  . CHOLECYSTECTOMY    . COLONOSCOPY  07/15/2010   Rectal Polyp, mild sigmoid diverticulosis, small internal hemorrhoids. BX:  hyperplastic polyp.  [facility] Kitchen DIAGNOSTIC LAPAROSCOPY    . ESOPHAGEAL DILATION  03/31/2013  . ESOPHAGOGASTRIC FUNDOPLICATION    . ESOPHAGOGASTRODUODENOSCOPY  07/15/2010   Schatzki ring, hiatal hernia, presbyesophagus   . HARDWARE REMOVAL N/A 04/04/2013   Procedure: HARDWARE REMOVAL;  Surgeon: Jessy Oto, MD;  Location: [facility];  Service: Orthopedics;  Laterality: N/A;  . HELLER MYOTOMY    . LUMBAR LAMINECTOMY N/A 04/04/2013   Procedure: Left L3-4 lateral recess decompression and Left L3-4 foraminotomy, Removal of Rod and Screws Left L4-5;  Surgeon: Jessy Oto, MD;  Location: [facility];  Service: Orthopedics;  Laterality: N/A;  . TONSILLECTOMY Right    nodals removed  . TUBAL LIGATION    . UPPER GI ENDOSCOPY      Short Social History:  Social History   Tobacco Use  . Smoking status: Never Smoker  . Smokeless tobacco: Never Used  Substance Use Topics  . Alcohol use: No    Alcohol/week: 0.0 standard drinks    Allergies  Allergen Reactions  . Sulfamethoxazole-Trimethoprim Swelling, Anaphylaxis and Rash    lips lips  . Statins Other (See Comments)    Other reaction(s): Other (See Comments) Body aches Nausea Body aches Nausea Body aches Nausea  . Augmentin [Amoxicillin-Pot Clavulanate]     Upset stomach   . Clindamycin/Lincomycin  Upset stomach  . Doxycycline     abd pain   . Duloxetine     Cymbalta= unknown Cymbalta= unknown  . Other     "Cat Gut Sutures"= abscess  . Oxycontin [Oxycodone Hcl]     migraine headache  . Erythromycin Nausea And Vomiting    Abdominal pain Abdominal pain  . Penicillins Itching and Rash  . Phenobarbital Rash    Current Outpatient Medications  Medication Sig Dispense Refill  . amLODipine (NORVASC) 2.5 MG tablet TAKE ONE TABLET BY MOUTH EVERY DAY 90 tablet 3  . cholestyramine (QUESTRAN) 4 g packet Take 1 packet (4 g total) by mouth daily. Take 2 hours before or after all other medications. 60 each 12  . metoprolol succinate (TOPROL-XL)  100 MG 24 hr tablet TAKE ONE TABLET BY MOUTH DAILY 90 tablet 3  . rosuvastatin (CRESTOR) 20 MG tablet TAKE ONE TABLET BY MOUTH EVERY DAY 90 tablet 0  . sertraline (ZOLOFT) 50 MG tablet Take 1 tablet (50 mg total) by mouth daily.    . traMADol (ULTRAM) 50 MG tablet Take 1 tablet (50 mg total) by mouth every 8 (eight) hours as needed for moderate pain. 60 tablet 0  . dicyclomine (BENTYL) 10 MG capsule Twice daily 1/2 hour before breakfast and bedtime. (Patient not taking: Reported on 07/02/2018) 60 capsule 3  . nitroGLYCERIN (NITROSTAT) 0.4 MG SL tablet Place 1 tablet (0.4 mg total) under the tongue every 5 (five) minutes as needed for chest pain. 25 tablet 0   No current facility-administered medications for this visit.     Review of Systems  Constitutional:  Constitutional negative. HENT: HENT negative.  Eyes: Eyes negative.  Respiratory: Respiratory negative.  Cardiovascular: Cardiovascular negative.  GI:       Right flank pain Musculoskeletal: Musculoskeletal negative.  Skin: Skin negative.  Neurological: Neurological negative. Hematologic: Hematologic/lymphatic negative.  Psychiatric: Psychiatric negative.        Objective:  Objective   Vitals:   07/02/18 0919  BP: (!) 149/72  Pulse: (!) 52  Resp: 20  Temp: 98.6 F (37 C)  SpO2: 94%  Weight: 200 lb (90.7 kg)  Height: 5\' 1"  (1.549 m)   Body mass index is 37.79 kg/m.  Physical Exam HENT:     Head: Normocephalic.  Eyes:     Pupils: Pupils are equal, round, and reactive to light.  Neck:     Vascular: No carotid bruit.  Cardiovascular:     Rate and Rhythm: Normal rate and regular rhythm.     Pulses:          Radial pulses are 2+ on the right side and 2+ on the left side.       Popliteal pulses are 2+ on the right side and 2+ on the left side.       Dorsalis pedis pulses are 2+ on the right side and 2+ on the left side.  Pulmonary:     Effort: Pulmonary effort is normal.     Breath sounds: Normal breath sounds.    Abdominal:     General: Abdomen is flat.     Palpations: Abdomen is soft. There is no mass.  Musculoskeletal: Normal range of motion.        General: No swelling.  Skin:    General: Skin is warm and dry.     Capillary Refill: Capillary refill takes less than 2 seconds.  Neurological:     Mental Status: She is alert.  Psychiatric:  Mood and Affect: Mood normal.     Data: I do not have images for review today but previous CT scan demonstrated SMA narrowing with concern for flow-limiting stenosis.     Assessment/Plan:     73 year old female with history of coronary artery disease was evaluated for abdominal pain with CT scan which demonstrated SMA stenosis.  At this time she does not have symptoms of mesenteric ischemia either chronic or acute.  We will follow her up in a few months with mesenteric duplex.  If that does not demonstrate any flow-limiting stenosis she can be discharged to a PRN follow-up.  If she develops symptoms in the interim I am certainly happy to see her sooner.  We discussed the signs and symptoms and she demonstrates good understanding.     Waynetta Sandy MD [facility]

## 2018-07-07 DIAGNOSIS — E782 Mixed hyperlipidemia: Secondary | ICD-10-CM | POA: Diagnosis not present

## 2018-07-07 DIAGNOSIS — I10 Essential (primary) hypertension: Secondary | ICD-10-CM | POA: Diagnosis not present

## 2018-07-07 DIAGNOSIS — E034 Atrophy of thyroid (acquired): Secondary | ICD-10-CM | POA: Diagnosis not present

## 2018-07-19 DIAGNOSIS — K580 Irritable bowel syndrome with diarrhea: Secondary | ICD-10-CM | POA: Diagnosis not present

## 2018-07-19 DIAGNOSIS — R7301 Impaired fasting glucose: Secondary | ICD-10-CM | POA: Diagnosis not present

## 2018-07-19 DIAGNOSIS — R0602 Shortness of breath: Secondary | ICD-10-CM | POA: Diagnosis not present

## 2018-07-19 DIAGNOSIS — Z6838 Body mass index (BMI) 38.0-38.9, adult: Secondary | ICD-10-CM | POA: Diagnosis not present

## 2018-07-19 DIAGNOSIS — E782 Mixed hyperlipidemia: Secondary | ICD-10-CM | POA: Diagnosis not present

## 2018-07-19 DIAGNOSIS — I119 Hypertensive heart disease without heart failure: Secondary | ICD-10-CM | POA: Diagnosis not present

## 2018-07-19 DIAGNOSIS — D6959 Other secondary thrombocytopenia: Secondary | ICD-10-CM | POA: Diagnosis not present

## 2018-07-21 ENCOUNTER — Encounter: Payer: Self-pay | Admitting: *Deleted

## 2018-08-25 DIAGNOSIS — M13849 Other specified arthritis, unspecified hand: Secondary | ICD-10-CM | POA: Diagnosis not present

## 2018-08-25 DIAGNOSIS — M13841 Other specified arthritis, right hand: Secondary | ICD-10-CM | POA: Diagnosis not present

## 2018-08-25 DIAGNOSIS — M79641 Pain in right hand: Secondary | ICD-10-CM | POA: Diagnosis not present

## 2018-10-12 ENCOUNTER — Telehealth: Payer: Self-pay | Admitting: Specialist

## 2018-10-12 NOTE — Telephone Encounter (Signed)
Pt called req a refill on Hydrocodone.  Call to advise pt @ [phone]

## 2018-10-12 NOTE — Telephone Encounter (Signed)
I called and advised that since we have not seen her since 06/25/2017, that she would need an appt to be evaluated first. She states that she understands this and has been sched for 10/15/2018 @ 215 with Dr. Louanne Skye

## 2018-10-15 ENCOUNTER — Ambulatory Visit (INDEPENDENT_AMBULATORY_CARE_PROVIDER_SITE_OTHER): Payer: Medicare Other | Admitting: Specialist

## 2018-10-15 ENCOUNTER — Encounter: Payer: Self-pay | Admitting: Specialist

## 2018-10-15 ENCOUNTER — Other Ambulatory Visit: Payer: Self-pay

## 2018-10-15 ENCOUNTER — Ambulatory Visit: Payer: Medicare Other

## 2018-10-15 VITALS — BP 146/72 | HR 54 | Ht 61.0 in | Wt 200.0 lb

## 2018-10-15 DIAGNOSIS — M5136 Other intervertebral disc degeneration, lumbar region: Secondary | ICD-10-CM

## 2018-10-15 DIAGNOSIS — M47816 Spondylosis without myelopathy or radiculopathy, lumbar region: Secondary | ICD-10-CM | POA: Diagnosis not present

## 2018-10-15 DIAGNOSIS — G8929 Other chronic pain: Secondary | ICD-10-CM

## 2018-10-15 DIAGNOSIS — M533 Sacrococcygeal disorders, not elsewhere classified: Secondary | ICD-10-CM

## 2018-10-15 NOTE — Progress Notes (Signed)
Subjective: Patient is seeing Benjiman Core, PA-C, and has been struggling with right-sided low back pain.  I have been asked to do a diagnostic/therapeutic sacroiliac injection.  Objective: She is point tender to palpation of the superior aspect of the right sacroiliac joint.  Procedure: Ultrasound-guided right sacroiliac injection: After sterile prep with Betadine, patient was in prone position and I identified the sacroiliac joint with ultrasound, guided a 22-gauge spinal needle from medial to lateral into the SI joint and injected 8 cc 1% lidocaine without epinephrine and 40 mg methylprednisolone.  She had excellent pain relief during the immediate anesthetic phase.  She will follow-up in 4 weeks with Dr. Louanne Skye.,

## 2018-10-15 NOTE — Progress Notes (Signed)
Office Visit Note   Patient: Jessica Gould           Date of Birth: January 08, 1946           MRN: [id number] Visit Date: 10/15/2018              Requested by: Rochel Brome, MD [address] [place], [place] [address] PCP: Rochel Brome, MD   Assessment & Plan: Visit Diagnoses:  1. Degenerative disc disease, lumbar   2. Chronic right SI joint pain   3. Spondylosis without myelopathy or radiculopathy, lumbar region     Plan: With patient's focal tenderness at the right SI joint I think it is reasonable to try injection there.  I asked Dr. Junius Roads in the clinic today if he would perform ultrasound-guided diagnostic/therapeutic right SI joint Marcaine/Depo-Medrol injection.  We will see how she does with this.  Follow-up in 4 weeks with Dr. Vernell Barrier for recheck.  If she does not have dramatic improvement of her right-sided low back pain I will plan to schedule MRI with and without contrast.  Follow-Up Instructions: Return in about 4 weeks (around 11/12/2018).   Orders:  Orders Placed This Encounter  Procedures   XR Lumbar Spine 2-3 Views   No orders of the defined types were placed in this encounter.     Procedures: No procedures performed   Clinical Data: No additional findings.   Subjective: Chief Complaint  Patient presents with   Lower Back - Follow-up    HPI 73 year old white female comes in today with complaints of right low back pain.  She is status post L4-5 fusion several years ago that required hardware removal.(See previous documentations for procedures) patient has continued to have chronic low back pain where she is currently localizing more to the right SI joint.  Denies lower extremity radiculopathy.  Some pain when she is ambulating or laying on her side.  Patient had called recently requesting refill of hydrocodone.  This was last given by Dr. Louanne Skye 2018.  Patient is unable to take oral NSAIDs due to this causing increased ringing in her ears.  She  does take Tylenol as needed.   Review of Systems No current cardiac pulmonary GI GU issues  Objective: Vital Signs: BP (!) 146/72 (BP Location: Left Arm, Patient Position: Sitting)    Pulse (!) 54    Ht 5\' 1"  (1.549 m)    Wt 200 lb (90.7 kg)    BMI 37.79 kg/m   Physical Exam HENT:     Head: Normocephalic.  Eyes:     Pupils: Pupils are equal, round, and reactive to light.  Pulmonary:     Effort: No respiratory distress.  Musculoskeletal:     Comments: Mild right lumbar paraspinal tenderness.  Patient is moderate to markedly tender over the right SI joint.  Nontender over the left.  Negative straight leg raise.  Positive right FABER test and negative on the left side.  Neurovas intact.  No focal motor deficits.    Neurological:     Mental Status: She is alert and oriented to person, place, and time.     Ortho Exam  Specialty Comments:  No specialty comments available.  Imaging: No results found.   PMFS History: Patient Active Problem List   Diagnosis Date Noted   Palpitations 04/01/2018   Coronary artery disease involving native coronary artery of native heart with angina pectoris ([place]) 04/01/2018   SOB (shortness of breath) 04/01/2018   Morbid obesity ([place])  01/07/2017   OSA (obstructive sleep apnea) 11/04/2016   Dyslipidemia 09/26/2016   History of anxiety 09/26/2016   History of depression 09/26/2016   History of migraine headaches 09/26/2016   History of coronary artery disease 09/26/2016   Spinal stenosis of lumbar region 04/05/2013    Class: Diagnosis of   ESSENTIAL HYPERTENSION, BENIGN 01/09/2009   DIZZINESS 01/09/2009   DYSLIPIDEMIA 01/05/2009   ANXIETY 01/05/2009   DEPRESSION 01/05/2009   MIGRAINE HEADACHE 01/05/2009   TINNITUS 01/05/2009   SPINAL STENOSIS, LUMBAR 01/05/2009   COLONIC POLYPS, HX OF 01/05/2009   History of gastroesophageal reflux (GERD) 01/05/2009   Past Medical History:  Diagnosis Date   Arthritis    CAD  (coronary artery disease)    GERD (gastroesophageal reflux disease)    Headache(784.0)    PONV (postoperative nausea and vomiting)    BP dropprd once   Sleep apnea    On CPAP    Family History  Problem Relation Age of Onset   Heart disease Father 76   Rheumatologic disease Father    Kidney cancer Father    Stomach cancer Mother    Heart disease Brother 18   Heart disease Sister 27   Rheumatologic disease Paternal Grandmother    Colon cancer Neg Hx     Past Surgical History:  Procedure Laterality Date   ABDOMINAL HYSTERECTOMY     APPENDECTOMY     BACK SURGERY  2004   lumbar   BREAST BIOPSY Left 06/23/2006   BREAST BIOPSY Left 04/12/2003   BREAST EXCISIONAL BIOPSY Left 07/13/2006   BREAST EXCISIONAL BIOPSY Left 2004   CHOLECYSTECTOMY     COLONOSCOPY  07/15/2010   Rectal Polyp, mild sigmoid diverticulosis, small internal hemorrhoids. BX: hyperplastic polyp.   DIAGNOSTIC LAPAROSCOPY     ESOPHAGEAL DILATION  03/31/2013   ESOPHAGOGASTRIC FUNDOPLICATION     ESOPHAGOGASTRODUODENOSCOPY  07/15/2010   Schatzki ring, hiatal hernia, presbyesophagus    HARDWARE REMOVAL N/A 04/04/2013   Procedure: HARDWARE REMOVAL;  Surgeon: Jessy Oto, MD;  Location: [facility];  Service: Orthopedics;  Laterality: N/A;   HELLER MYOTOMY     LUMBAR LAMINECTOMY N/A 04/04/2013   Procedure: Left L3-4 lateral recess decompression and Left L3-4 foraminotomy, Removal of Rod and Screws Left L4-5;  Surgeon: Jessy Oto, MD;  Location: [facility];  Service: Orthopedics;  Laterality: N/A;   TONSILLECTOMY Right    nodals removed   TUBAL LIGATION     UPPER GI ENDOSCOPY     Social History   Occupational History   Not on file  Tobacco Use   Smoking status: Never Smoker   Smokeless tobacco: Never Used  Substance and Sexual Activity   Alcohol use: No    Alcohol/week: 0.0 standard drinks   Drug use: No   Sexual activity: Not on file

## 2018-10-21 DIAGNOSIS — I119 Hypertensive heart disease without heart failure: Secondary | ICD-10-CM | POA: Diagnosis not present

## 2018-10-21 DIAGNOSIS — D6959 Other secondary thrombocytopenia: Secondary | ICD-10-CM | POA: Diagnosis not present

## 2018-10-21 DIAGNOSIS — R197 Diarrhea, unspecified: Secondary | ICD-10-CM | POA: Diagnosis not present

## 2018-10-21 DIAGNOSIS — F321 Major depressive disorder, single episode, moderate: Secondary | ICD-10-CM | POA: Diagnosis not present

## 2018-10-21 DIAGNOSIS — R05 Cough: Secondary | ICD-10-CM | POA: Diagnosis not present

## 2018-10-21 DIAGNOSIS — M545 Low back pain: Secondary | ICD-10-CM | POA: Diagnosis not present

## 2018-10-21 DIAGNOSIS — R7301 Impaired fasting glucose: Secondary | ICD-10-CM | POA: Diagnosis not present

## 2018-10-21 DIAGNOSIS — E782 Mixed hyperlipidemia: Secondary | ICD-10-CM | POA: Diagnosis not present

## 2018-10-21 DIAGNOSIS — R001 Bradycardia, unspecified: Secondary | ICD-10-CM | POA: Diagnosis not present

## 2018-10-23 ENCOUNTER — Telehealth: Payer: Self-pay | Admitting: Adult Health

## 2018-10-23 DIAGNOSIS — Z20822 Contact with and (suspected) exposure to covid-19: Secondary | ICD-10-CM

## 2018-10-23 NOTE — Addendum Note (Signed)
Addended by: Carlisle Beers on: 10/23/2018 03:44 PM   Modules accepted: Orders

## 2018-10-23 NOTE — Telephone Encounter (Signed)
Left message regarding possible need for future testing regarding Covid 19 exposure.

## 2018-10-24 ENCOUNTER — Other Ambulatory Visit: Payer: Medicare Other

## 2018-10-24 DIAGNOSIS — Z20822 Contact with and (suspected) exposure to covid-19: Secondary | ICD-10-CM

## 2018-10-25 LAB — NOVEL CORONAVIRUS, NAA: SARS-CoV-2, NAA: NOT DETECTED

## 2018-11-03 DIAGNOSIS — R0602 Shortness of breath: Secondary | ICD-10-CM | POA: Diagnosis not present

## 2018-11-03 DIAGNOSIS — R7301 Impaired fasting glucose: Secondary | ICD-10-CM | POA: Diagnosis not present

## 2018-11-03 DIAGNOSIS — E782 Mixed hyperlipidemia: Secondary | ICD-10-CM | POA: Diagnosis not present

## 2018-11-03 DIAGNOSIS — R05 Cough: Secondary | ICD-10-CM | POA: Diagnosis not present

## 2018-11-03 DIAGNOSIS — I10 Essential (primary) hypertension: Secondary | ICD-10-CM | POA: Diagnosis not present

## 2018-11-08 ENCOUNTER — Telehealth: Payer: Self-pay | Admitting: Cardiology

## 2018-11-08 DIAGNOSIS — F321 Major depressive disorder, single episode, moderate: Secondary | ICD-10-CM | POA: Diagnosis not present

## 2018-11-08 DIAGNOSIS — I119 Hypertensive heart disease without heart failure: Secondary | ICD-10-CM | POA: Diagnosis not present

## 2018-11-08 DIAGNOSIS — R001 Bradycardia, unspecified: Secondary | ICD-10-CM | POA: Diagnosis not present

## 2018-11-08 NOTE — Telephone Encounter (Signed)
New Message            Patient is calling to see if she can send Dr. Jenkins Rouge  A PDF of her bp. Pls call to advise.

## 2018-11-08 NOTE — Telephone Encounter (Signed)
Returned the call to the patient. The patient has an appointment on Friday, 11/12/2018 with Dr. Percival Spanish. She will try to send her blood pressure readings through MyChart.

## 2018-11-09 ENCOUNTER — Telehealth: Payer: Self-pay | Admitting: Cardiology

## 2018-11-09 NOTE — Telephone Encounter (Signed)
Mychart, smartphone, verbal consent, pre reg complete 11/09/18 AF

## 2018-11-11 NOTE — Progress Notes (Signed)
Virtual Visit via Video Note   This visit type was conducted due to national recommendations for restrictions regarding the COVID-19 Pandemic (e.g. social distancing) in an effort to limit this patient's exposure and mitigate transmission in our community.  Due to her co-morbid illnesses, this patient is at least at moderate risk for complications without adequate follow up.  This format is felt to be most appropriate for this patient at this time.  All issues noted in this document were discussed and addressed.  A limited physical exam was performed with this format.  Please refer to the patient's chart for her consent to telehealth for [facility].   Date:  11/12/2018   ID:  Jessica Gould, DOB 1946-02-18, MRN [id number]  Patient Location: Home Provider Location: Home  PCP:  Rochel Brome, MD  Cardiologist:  Minus Breeding, MD  Electrophysiologist:  None   Evaluation Performed:  Follow-Up Visit  Chief Complaint:  Fatigue   History of Present Illness:    Jessica Gould is a 73 y.o. female who presents for evaluation after PCI. In 2017 she had a cardiac cath at 2017.  She was found to have 30% LAD stenosis. The circumflex had 95-99% proximal stenosis. The right coronary artery 30% stenosis. The EF 70%. She had DES stenting. She had some DOE but a negative POET (Plain Old Exercise Treadmill) last fall.     Since I last saw her she says she did have one episode of chest discomfort that was about 4 days ago.  She was not sure whether this was like her previous angina.  1 week spontaneously.  She did not take any nitroglycerin.  Over the past 3 or 4 days she is not had any further symptoms.  She has been very fatigued and has not been doing as much.  Her heart rate has been low in the 40s and 50s consistently.  Her primary care doctor has backed down on her metoprolol.  She is down from 100 mg to 25 mg.  She denies any presyncope or syncope.  She says she feels a little bit better with  her heart rate going up.  However, her blood pressure seems to be higher than I would like for target.  She did have one episode of tachypalpitations.  She got an apple watch but she has yet to send me any tracings.  The patient does not have symptoms concerning for COVID-19 infection (fever, chills, cough, or new shortness of breath).    Past Medical History:  Diagnosis Date  . Arthritis   . CAD (coronary artery disease)   . GERD (gastroesophageal reflux disease)   . Headache(784.0)   . PONV (postoperative nausea and vomiting)    BP dropprd once  . Sleep apnea    On CPAP   Past Surgical History:  Procedure Laterality Date  . ABDOMINAL HYSTERECTOMY    . APPENDECTOMY    . BACK SURGERY  2004   lumbar  . BREAST BIOPSY Left 06/23/2006  . BREAST BIOPSY Left 04/12/2003  . BREAST EXCISIONAL BIOPSY Left 07/13/2006  . BREAST EXCISIONAL BIOPSY Left 2004  . CHOLECYSTECTOMY    . COLONOSCOPY  07/15/2010   Rectal Polyp, mild sigmoid diverticulosis, small internal hemorrhoids. BX: hyperplastic polyp.  Marland Kitchen DIAGNOSTIC LAPAROSCOPY    . ESOPHAGEAL DILATION  03/31/2013  . ESOPHAGOGASTRIC FUNDOPLICATION    . ESOPHAGOGASTRODUODENOSCOPY  07/15/2010   Schatzki ring, hiatal hernia, presbyesophagus   . HARDWARE REMOVAL N/A 04/04/2013   Procedure: HARDWARE REMOVAL;  Surgeon: Jessy Oto, MD;  Location: [facility];  Service: Orthopedics;  Laterality: N/A;  . HELLER MYOTOMY    . LUMBAR LAMINECTOMY N/A 04/04/2013   Procedure: Left L3-4 lateral recess decompression and Left L3-4 foraminotomy, Removal of Rod and Screws Left L4-5;  Surgeon: Jessy Oto, MD;  Location: [facility];  Service: Orthopedics;  Laterality: N/A;  . TONSILLECTOMY Right    nodals removed  . TUBAL LIGATION    . UPPER GI ENDOSCOPY       Current Meds  Medication Sig  . amLODipine (NORVASC) 2.5 MG tablet TAKE ONE TABLET BY MOUTH EVERY DAY  . cholestyramine (QUESTRAN) 4 g packet Take 1 packet (4 g total) by mouth daily. Take 2 hours before or  after all other medications.  . metoprolol succinate (TOPROL-XL) 100 MG 24 hr tablet Take 25 mg by mouth daily. Take with or immediately following a meal.  . nitroGLYCERIN (NITROSTAT) 0.4 MG SL tablet Place 1 tablet (0.4 mg total) under the tongue every 5 (five) minutes as needed for chest pain.  . rosuvastatin (CRESTOR) 20 MG tablet TAKE ONE TABLET BY MOUTH EVERY DAY  . sertraline (ZOLOFT) 50 MG tablet Take 1 tablet (50 mg total) by mouth daily.  . traMADol (ULTRAM) 50 MG tablet Take 1 tablet (50 mg total) by mouth every 8 (eight) hours as needed for moderate pain.  . [DISCONTINUED] metoprolol succinate (TOPROL-XL) 100 MG 24 hr tablet TAKE ONE TABLET BY MOUTH DAILY    . Allergies:   Sulfamethoxazole-trimethoprim, Statins, Augmentin [amoxicillin-pot clavulanate], Clindamycin/lincomycin, Doxycycline, Duloxetine, Other, Oxycontin [oxycodone hcl], Erythromycin, Penicillins, and Phenobarbital   Social History   Tobacco Use  . Smoking status: Never Smoker  . Smokeless tobacco: Never Used  Substance Use Topics  . Alcohol use: No    Alcohol/week: 0.0 standard drinks  . Drug use: No     Family Hx: The patient's family history includes Heart disease (age of onset: 47) in her father; Heart disease (age of onset: 14) in her brother; Heart disease (age of onset: 55) in her sister; Kidney cancer in her father; Rheumatologic disease in her father and paternal grandmother; Stomach cancer in her mother. There is no history of Colon cancer.  ROS:   Please see the history of present illness.    As stated in the HPI and negative for all other systems.   Prior CV studies:   The following studies were reviewed today:  Labs  Labs/Other Tests and Data Reviewed:    EKG:  No ECG reviewed.  Recent Labs: No results found for requested labs within last 8760 hours.   Recent Lipid Panel No results found for: CHOL, TRIG, HDL, CHOLHDL, LDLCALC, LDLDIRECT  Wt Readings from Last 3 Encounters:  11/12/18  198 lb (89.8 kg)  10/15/18 200 lb (90.7 kg)  07/02/18 200 lb (90.7 kg)     Objective:    Vital Signs:  BP (!) 162/80   Pulse (!) 59   Ht 5\' 1"  (1.549 m)   Wt 198 lb (89.8 kg)   BMI 37.41 kg/m    VITAL SIGNS:  reviewed GEN:  no acute distress EYES:  sclerae anicteric, EOMI - Extraocular Movements Intact RESPIRATORY:  normal respiratory effort, symmetric expansion PSYCH:  normal affect  ASSESSMENT & PLAN:    CAD:   She had a negative POET (Plain Old Exercise Treadmill) last fall.    She did have one episode of chest discomfort.  Her stress test was actually submaximal but very close.  I think her symptoms that she just had were atypical and only one isolated episode.  However, she will let me know if she has any increasing symptoms or episodes and I would do further testing.   DYSLIPIDEMIA:    LDL was 85 with an HDL of 52.  Total cholesterol 195 and triglycerides 289.  She will continue with meds as listed.  HTN:  The blood pressure is not at target today.  I am going to increase her Norvasc to 5 mg daily.  She can keep a blood pressure diary.  Further adjustments will be based on these results.   PALPITATIONS:  We talked about how she would attach a PDF file of her tracings so I can evaluate the one episode of palpitations that she had.    COVID-19 Education: The signs and symptoms of COVID-19 were discussed with the patient and how to seek care for testing (follow up with PCP or arrange E-visit).  The importance of social distancing was discussed today.  Time:   Today, I have spent 20 minutes with the patient with telehealth technology discussing the above problems.     Medication Adjustments/Labs and Tests Ordered: Current medicines are reviewed at length with the patient today.  Concerns regarding medicines are outlined above.   Tests Ordered: No orders of the defined types were placed in this encounter.   Medication Changes: No orders of the defined types were placed  in this encounter.   Follow Up:  In Person in the office in four months  Signed, Minus Breeding, MD  11/12/2018 1:58 PM    [place]

## 2018-11-12 ENCOUNTER — Telehealth (INDEPENDENT_AMBULATORY_CARE_PROVIDER_SITE_OTHER): Payer: Medicare Other | Admitting: Cardiology

## 2018-11-12 ENCOUNTER — Encounter: Payer: Self-pay | Admitting: Cardiology

## 2018-11-12 VITALS — BP 162/80 | HR 59 | Ht 61.0 in | Wt 198.0 lb

## 2018-11-12 DIAGNOSIS — I25119 Atherosclerotic heart disease of native coronary artery with unspecified angina pectoris: Secondary | ICD-10-CM

## 2018-11-12 DIAGNOSIS — R002 Palpitations: Secondary | ICD-10-CM

## 2018-11-12 DIAGNOSIS — I10 Essential (primary) hypertension: Secondary | ICD-10-CM | POA: Diagnosis not present

## 2018-11-12 DIAGNOSIS — Z7189 Other specified counseling: Secondary | ICD-10-CM | POA: Diagnosis not present

## 2018-11-12 MED ORDER — AMLODIPINE BESYLATE 5 MG PO TABS: 5.0000 mg | ORAL_TABLET | Freq: Every day | ORAL | 1 refills | Status: DC

## 2018-11-12 NOTE — Patient Instructions (Signed)
Medication Instructions:  INCREASE YOUR AMLODIPINE TO 5 MG DAILY   If you need a refill on your cardiac medications before your next appointment, please call your pharmacy.   Lab work: NONE  Testing/Procedures: NONE  Follow-Up: At [facility], you and your health needs are our priority.  As part of our continuing mission to provide you with exceptional heart care, we have created designated Provider Care Teams.  These Care Teams include your primary Cardiologist (physician) and Advanced Practice Providers (APPs -  Physician Assistants and Nurse Practitioners) who all work together to provide you with the care you need, when you need it. You will need a follow up appointment in 4 months IN THE OFFICE Please call our office 2 months in advance to schedule this appointment.  You may see Minus Breeding, MD or one of the following Advanced Practice Providers on your designated Care Team:   Rosaria Ferries, PA-C . Jory Sims, DNP, ANP

## 2018-11-17 DIAGNOSIS — Z6839 Body mass index (BMI) 39.0-39.9, adult: Secondary | ICD-10-CM | POA: Diagnosis not present

## 2018-11-17 DIAGNOSIS — Z0000 Encounter for general adult medical examination without abnormal findings: Secondary | ICD-10-CM | POA: Diagnosis not present

## 2018-11-17 DIAGNOSIS — R001 Bradycardia, unspecified: Secondary | ICD-10-CM | POA: Diagnosis not present

## 2018-11-26 ENCOUNTER — Telehealth: Payer: Self-pay | Admitting: Medical

## 2018-11-26 DIAGNOSIS — I25119 Atherosclerotic heart disease of native coronary artery with unspecified angina pectoris: Secondary | ICD-10-CM | POA: Diagnosis not present

## 2018-11-26 DIAGNOSIS — I2510 Atherosclerotic heart disease of native coronary artery without angina pectoris: Secondary | ICD-10-CM | POA: Diagnosis not present

## 2018-11-26 DIAGNOSIS — Z8673 Personal history of transient ischemic attack (TIA), and cerebral infarction without residual deficits: Secondary | ICD-10-CM | POA: Diagnosis not present

## 2018-11-26 DIAGNOSIS — E785 Hyperlipidemia, unspecified: Secondary | ICD-10-CM | POA: Diagnosis not present

## 2018-11-26 DIAGNOSIS — Z7982 Long term (current) use of aspirin: Secondary | ICD-10-CM | POA: Diagnosis not present

## 2018-11-26 DIAGNOSIS — Z1159 Encounter for screening for other viral diseases: Secondary | ICD-10-CM | POA: Diagnosis not present

## 2018-11-26 DIAGNOSIS — R079 Chest pain, unspecified: Secondary | ICD-10-CM | POA: Diagnosis not present

## 2018-11-26 DIAGNOSIS — Z9049 Acquired absence of other specified parts of digestive tract: Secondary | ICD-10-CM | POA: Diagnosis not present

## 2018-11-26 DIAGNOSIS — E669 Obesity, unspecified: Secondary | ICD-10-CM | POA: Diagnosis not present

## 2018-11-26 DIAGNOSIS — Z6837 Body mass index (BMI) 37.0-37.9, adult: Secondary | ICD-10-CM | POA: Diagnosis not present

## 2018-11-26 DIAGNOSIS — I10 Essential (primary) hypertension: Secondary | ICD-10-CM | POA: Diagnosis not present

## 2018-11-26 NOTE — Telephone Encounter (Signed)
Called pt back. Patient reports Chest pain event 3 weeks ago that woke patient up and lasted 30-40 minutes. This morning she had the same pain. It woke patient up and lasted about 30 minutes. Patient took NTG and pain was relieved. Pain is squeezing type pain mid sternum, 5/10. Denies N,V. Denies swelling, syncope, pre-syncope. Admits she has been a little anxious. She does admit to palpitations during te last 3 weeks, but not associated with the chest pain.Patient says this is very same kind of pain and location before pt went to the hospital before receiving a stent. She reports HR and B/P reports have been normal.  Sees Dr. Percival Spanish regularly.  I recommended patient be evaluated in the ER and patient agreed to this.

## 2018-11-27 DIAGNOSIS — Z955 Presence of coronary angioplasty implant and graft: Secondary | ICD-10-CM | POA: Insufficient documentation

## 2018-11-27 DIAGNOSIS — I2510 Atherosclerotic heart disease of native coronary artery without angina pectoris: Secondary | ICD-10-CM | POA: Diagnosis not present

## 2018-11-28 DIAGNOSIS — R079 Chest pain, unspecified: Secondary | ICD-10-CM | POA: Diagnosis not present

## 2018-11-28 DIAGNOSIS — I10 Essential (primary) hypertension: Secondary | ICD-10-CM | POA: Diagnosis not present

## 2018-11-28 DIAGNOSIS — E669 Obesity, unspecified: Secondary | ICD-10-CM | POA: Diagnosis not present

## 2018-11-28 DIAGNOSIS — E7800 Pure hypercholesterolemia, unspecified: Secondary | ICD-10-CM | POA: Diagnosis not present

## 2018-11-28 DIAGNOSIS — I2510 Atherosclerotic heart disease of native coronary artery without angina pectoris: Secondary | ICD-10-CM | POA: Diagnosis not present

## 2018-11-28 MED ORDER — HYDRALAZINE HCL 20 MG/ML IJ SOLN
5.00 | INTRAMUSCULAR | Status: DC
Start: ? — End: 2018-11-28

## 2018-11-28 MED ORDER — HYDROCORTISONE 1 % EX CREA
1.00 | TOPICAL_CREAM | CUTANEOUS | Status: DC
Start: ? — End: 2018-11-28

## 2018-11-28 MED ORDER — VENLAFAXINE HCL 25 MG PO TABS
75.00 | ORAL_TABLET | ORAL | Status: DC
Start: 2018-11-28 — End: 2018-11-28

## 2018-11-28 MED ORDER — CHOLESTYRAMINE 4 G PO PACK
1.00 | PACK | ORAL | Status: DC
Start: 2018-11-29 — End: 2018-11-28

## 2018-11-28 MED ORDER — DOCUSATE SODIUM 100 MG PO CAPS
100.00 | ORAL_CAPSULE | ORAL | Status: DC
Start: ? — End: 2018-11-28

## 2018-11-28 MED ORDER — ACETAMINOPHEN 325 MG PO TABS
650.00 | ORAL_TABLET | ORAL | Status: DC
Start: ? — End: 2018-11-28

## 2018-11-28 MED ORDER — ATORVASTATIN CALCIUM 80 MG PO TABS
80.00 | ORAL_TABLET | ORAL | Status: DC
Start: 2018-11-28 — End: 2018-11-28

## 2018-11-28 MED ORDER — METOPROLOL SUCCINATE ER 25 MG PO TB24
25.00 | ORAL_TABLET | ORAL | Status: DC
Start: 2018-11-28 — End: 2018-11-28

## 2018-11-28 MED ORDER — ASPIRIN EC 81 MG PO TBEC
81.00 | DELAYED_RELEASE_TABLET | ORAL | Status: DC
Start: 2018-11-29 — End: 2018-11-28

## 2018-11-28 MED ORDER — ONDANSETRON HCL 4 MG/2ML IJ SOLN
4.00 | INTRAMUSCULAR | Status: DC
Start: ? — End: 2018-11-28

## 2018-11-28 MED ORDER — TRAMADOL HCL 50 MG PO TABS
50.00 | ORAL_TABLET | ORAL | Status: DC
Start: ? — End: 2018-11-28

## 2018-11-28 MED ORDER — AMLODIPINE BESYLATE 5 MG PO TABS
5.00 | ORAL_TABLET | ORAL | Status: DC
Start: 2018-11-28 — End: 2018-11-28

## 2018-11-28 MED ORDER — NITROGLYCERIN 0.4 MG SL SUBL
.40 | SUBLINGUAL_TABLET | SUBLINGUAL | Status: DC
Start: ? — End: 2018-11-28

## 2018-12-01 ENCOUNTER — Ambulatory Visit: Payer: Medicare Other | Admitting: Specialist

## 2018-12-07 ENCOUNTER — Telehealth: Payer: Self-pay | Admitting: *Deleted

## 2018-12-07 NOTE — Telephone Encounter (Signed)
Patient scheduled for office visit 7/16, Dr Percival Spanish doing virtual visits. Need to change to virtual or another day Left message to call back

## 2018-12-08 NOTE — Telephone Encounter (Signed)
Spoke with patient and she is out of town. Moved appt to next week when she will be in town for another appt

## 2018-12-09 ENCOUNTER — Ambulatory Visit: Payer: Medicare Other | Admitting: Cardiology

## 2018-12-13 DIAGNOSIS — M13841 Other specified arthritis, right hand: Secondary | ICD-10-CM | POA: Diagnosis not present

## 2018-12-13 DIAGNOSIS — M79641 Pain in right hand: Secondary | ICD-10-CM | POA: Diagnosis not present

## 2018-12-13 DIAGNOSIS — M19049 Primary osteoarthritis, unspecified hand: Secondary | ICD-10-CM | POA: Insufficient documentation

## 2018-12-15 ENCOUNTER — Telehealth: Payer: Self-pay | Admitting: Cardiology

## 2018-12-15 NOTE — Progress Notes (Signed)
Cardiology Office Note   Date:  12/16/2018   ID:  Jessica Gould, DOB 04/09/1946, MRN [id number] PCP:  Rochel Brome, MD  Cardiologist:   Minus Breeding, MD    Chief Complaint  Patient presents with  . Coronary Artery Disease      History of Present Illness: Jessica Gould is a 73 y.o. female who presents for evaluation after PCI. In 2017 she had a cardiac cath at 2017.  She was found to have 30% LAD stenosis. The circumflex had 95-99% proximal stenosis. The right coronary artery 30% stenosis. The EF 70%. She had DES stenting. She did have a small groin hematoma complicating this.  Since I last saw her she was in [facility] with chest pain.  She ruled out and had a cath with a patent left circ stent.  I reviewed these records for this visit.     She said she presented with some chest discomfort but was ultimately told it was because her potassium and magnesium were low and these were supplemented.  Since then she has not had any of this discomfort.  She has not required any nitroglycerin.  She has rare palpitations.  She is not having any presyncope or syncope.  She has had some right upper quadrant pain and was told she had fatty liver and was told that she needed to see a GI doctor.  She is also getting ready to have hand surgery.  Past Medical History:  Diagnosis Date  . Arthritis   . CAD (coronary artery disease)   . GERD (gastroesophageal reflux disease)   . Headache(784.0)   . PONV (postoperative nausea and vomiting)   . Sleep apnea    On CPAP    Past Surgical History:  Procedure Laterality Date  . ABDOMINAL HYSTERECTOMY    . APPENDECTOMY    . BACK SURGERY  2004   lumbar  . BREAST BIOPSY Left 06/23/2006  . BREAST BIOPSY Left 04/12/2003  . BREAST EXCISIONAL BIOPSY Left 07/13/2006  . BREAST EXCISIONAL BIOPSY Left 2004  . CHOLECYSTECTOMY    . COLONOSCOPY  07/15/2010   Rectal Polyp, mild sigmoid diverticulosis, small internal hemorrhoids. BX: hyperplastic polyp.  Marland Kitchen  DIAGNOSTIC LAPAROSCOPY    . ESOPHAGEAL DILATION  03/31/2013  . ESOPHAGOGASTRIC FUNDOPLICATION    . ESOPHAGOGASTRODUODENOSCOPY  07/15/2010   Schatzki ring, hiatal hernia, presbyesophagus   . HARDWARE REMOVAL N/A 04/04/2013   Procedure: HARDWARE REMOVAL;  Surgeon: Jessy Oto, MD;  Location: [place];  Service: Orthopedics;  Laterality: N/A;  . HELLER MYOTOMY    . LUMBAR LAMINECTOMY N/A 04/04/2013   Procedure: Left L3-4 lateral recess decompression and Left L3-4 foraminotomy, Removal of Rod and Screws Left L4-5;  Surgeon: Jessy Oto, MD;  Location: [place];  Service: Orthopedics;  Laterality: N/A;  . TONSILLECTOMY Right    nodals removed  . TUBAL LIGATION    . UPPER GI ENDOSCOPY       Current Outpatient Medications  Medication Sig Dispense Refill  . amLODipine (NORVASC) 5 MG tablet Take 1 tablet (5 mg total) by mouth daily. 90 tablet 1  . aspirin EC 81 MG tablet Take 81 mg by mouth daily.    . cholestyramine (QUESTRAN) 4 g packet Take 1 packet (4 g total) by mouth daily. Take 2 hours before or after all other medications. 60 each 12  . metoprolol succinate (TOPROL-XL) 25 MG 24 hr tablet Take 25 mg by mouth daily. Take with or immediately following a meal.     .  nitroGLYCERIN (NITROSTAT) 0.4 MG SL tablet Place 1 tablet (0.4 mg total) under the tongue every 5 (five) minutes as needed for chest pain. 25 tablet 0  . rosuvastatin (CRESTOR) 40 MG tablet Take 40 mg by mouth daily.    . traMADol (ULTRAM) 50 MG tablet Take 1 tablet (50 mg total) by mouth every 8 (eight) hours as needed for moderate pain. 60 tablet 0  . venlafaxine (EFFEXOR) 75 MG tablet Take 1 tablet by mouth daily.     No current facility-administered medications for this visit.     Allergies:   Sulfamethoxazole-trimethoprim, Statins, Augmentin [amoxicillin-pot clavulanate], Clindamycin/lincomycin, Doxycycline, Duloxetine, Other, Oxycontin [oxycodone hcl], Erythromycin, Penicillins, and Phenobarbital    ROS:  Please see the  history of present illness.   Otherwise, review of systems are positive for back pain.   All other systems are reviewed and negative.    PHYSICAL EXAM: VS:  BP 138/75   Pulse 73   Ht 5\' 1"  (1.549 m)   Wt 201 lb 12.8 oz (91.5 kg)   SpO2 95%   BMI 38.13 kg/m  , BMI Body mass index is 38.13 kg/m.  GENERAL:  Well appearing NECK:  No jugular venous distention, waveform within normal limits, carotid upstroke brisk and symmetric, no bruits, no thyromegaly LUNGS:  Clear to auscultation bilaterally CHEST:  Unremarkable HEART:  PMI not displaced or sustained,S1 and S2 within normal limits, no S3, no S4, no clicks, no rubs, no murmurs ABD:  Flat, positive bowel sounds normal in frequency in pitch, no bruits, no rebound, no guarding, no midline pulsatile mass, no hepatomegaly, no splenomegaly well-healed abdominal scars. EXT:  2 plus pulses throughout, no edema, no cyanosis no clubbing    EKG:  EKG is  ordered today. Sinus rhythm, rate 58, axis within normal limits, intervals normal limits, no acute ST-T wave changes.  Recent Labs: No results found for requested labs within last 8760 hours.    Lipid Panel No results found for: CHOL, TRIG, HDL, CHOLHDL, VLDL, LDLCALC, LDLDIRECT    Wt Readings from Last 3 Encounters:  12/16/18 201 lb 12.8 oz (91.5 kg)  11/12/18 198 lb (89.8 kg)  10/15/18 200 lb (90.7 kg)      Other studies Reviewed: Additional studies/ records that were reviewed today include: [facility] records for her recent GI surgical procedure Review of the above records demonstrates:   See above and below   ASSESSMENT AND PLAN:  CAD:   The patient has no new sypmtoms.  No further cardiovascular testing is indicated.  We will continue with aggressive risk reduction and meds as listed.  DYSLIPIDEMIA:    Her LDL at target.  No change in therapy.  The LDL was 85 and HDL 52.  This ratio is reasonable.  She has been sensitive to medications.   HTN:  The blood pressure is at target.  No  change in therapy.    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  She will transmit we taught her how to day should she have any tachypalpitations that she wants me look at.   HYPOKALEMIA: I will check a basic metabolic profile today.  PREOP: The patient had no obstructive disease on cath.  She has no new symptoms.  She is not going for high risk procedure.  According to ACC/AHA guidelines she is at acceptable risk for the planned surgery.  Unless absolutely prohibitive I would like for her to continue the aspirin.  Current medicines are reviewed at length with  the patient today.  The patient does not have concerns regarding medicines.  The following changes have been made:  None  Labs/ tests ordered today include:   Orders Placed This Encounter  Procedures  . Basic metabolic panel  . Magnesium     Disposition:   FU with APP in 6 months    Signed, Minus Breeding, MD  12/16/2018 11:15 AM    [place]

## 2018-12-15 NOTE — Telephone Encounter (Signed)
° ° °  COVID-19 Pre-Screening Questions: ° °• In the past 7 to 10 days have you had a cough,  shortness of breath, headache, congestion, fever (100 or greater) body aches, chills, sore throat, or sudden loss of taste or sense of smell? No °• Have you been around anyone with known Covid 19. °• Have you been around anyone who is awaiting Covid 19 test results in the past 7 to 10 days? no °• Have you been around anyone who has been exposed to Covid 19, or has mentioned symptoms of Covid 19 within the past 7 to 10 days? no ° °If you have any concerns/questions about symptoms patients report during screening (either on the phone or at threshold). Contact the provider seeing the patient or DOD for further guidance.  If neither are available contact a member of the leadership team. ° ° ° °   ° ° ° ° ° °

## 2018-12-16 ENCOUNTER — Other Ambulatory Visit: Payer: Self-pay

## 2018-12-16 ENCOUNTER — Ambulatory Visit (INDEPENDENT_AMBULATORY_CARE_PROVIDER_SITE_OTHER): Payer: Medicare Other | Admitting: Cardiology

## 2018-12-16 ENCOUNTER — Encounter: Payer: Self-pay | Admitting: Cardiology

## 2018-12-16 VITALS — BP 138/75 | HR 73 | Ht 61.0 in | Wt 201.8 lb

## 2018-12-16 DIAGNOSIS — Z01810 Encounter for preprocedural cardiovascular examination: Secondary | ICD-10-CM | POA: Diagnosis not present

## 2018-12-16 DIAGNOSIS — Z79899 Other long term (current) drug therapy: Secondary | ICD-10-CM | POA: Diagnosis not present

## 2018-12-16 DIAGNOSIS — E785 Hyperlipidemia, unspecified: Secondary | ICD-10-CM | POA: Diagnosis not present

## 2018-12-16 DIAGNOSIS — R002 Palpitations: Secondary | ICD-10-CM

## 2018-12-16 DIAGNOSIS — I25119 Atherosclerotic heart disease of native coronary artery with unspecified angina pectoris: Secondary | ICD-10-CM

## 2018-12-16 LAB — BASIC METABOLIC PANEL
BUN/Creatinine Ratio: 23 (ref 12–28)
BUN: 22 mg/dL (ref 8–27)
CO2: 22 mmol/L (ref 20–29)
Calcium: 9.8 mg/dL (ref 8.7–10.3)
Chloride: 104 mmol/L (ref 96–106)
Creatinine, Ser: 0.96 mg/dL (ref 0.57–1.00)
GFR calc Af Amer: 67 mL/min/{1.73_m2} (ref >59)
GFR calc non Af Amer: 58 mL/min/{1.73_m2} — ABNORMAL LOW (ref >59)
Glucose: 110 mg/dL — ABNORMAL HIGH (ref 65–99)
Potassium: 4.4 mmol/L (ref 3.5–5.2)
Sodium: 140 mmol/L (ref 134–144)

## 2018-12-16 LAB — MAGNESIUM: Magnesium: 2.1 mg/dL (ref 1.6–2.3)

## 2018-12-16 NOTE — Patient Instructions (Signed)
Medication Instructions:  The current medical regimen is effective;  continue present plan and medications as directed. Please refer to the Current Medication list given to you today. If you need a refill on your cardiac medications before your next appointment, please call your pharmacy.  Labwork: BMET AND MAG TODAY HERE IN OUR OFFICE AT [facility]    Take the provided lab slips with you to the lab for your blood draw.   When you have your labs (blood work) drawn today and your tests are completely normal, you will receive your results only by MyChart Message (if you have MyChart) -OR-  A paper copy in the mail.  If you have any lab test that is abnormal or we need to change your treatment, we will call you to review these results.  Follow-Up: You will need a follow up appointment in 6 months WITH A PA LISTED BELOW.  Please call our office 2 months in advance, 03-2019 to schedule this, 05-2019 appointment.  You may see Minus Breeding, MD or one of the following Advanced Practice Providers on your designated Care Team:  Rosaria Ferries, PA-C Jory Sims, DNP, ANP     At [facility], you and your health needs are our priority.  As part of our continuing mission to provide you with exceptional heart care, we have created designated Provider Care Teams.  These Care Teams include your primary Cardiologist (physician) and Advanced Practice Providers (APPs -  Physician Assistants and Nurse Practitioners) who all work together to provide you with the care you need, when you need it.  Thank you for choosing [facility] at [facility]!!

## 2018-12-17 ENCOUNTER — Telehealth: Payer: Self-pay

## 2018-12-17 NOTE — Telephone Encounter (Signed)
   [facility] Pre-operative Risk Assessment    Request for surgical clearance:  1. What type of surgery is being performed? Right ring and middle finger PIP arthroplasty   2. When is this surgery scheduled? 01-11-2019   3. What type of clearance is required (medical clearance vs. Pharmacy clearance to hold med vs. Both)? Both  4. Are there any medications that need to be held prior to surgery and how long?Aspirin 5-7 days prior   5. Practice name and name of physician performing surgery? [facility], Dr. Roseanne Kaufman    6. What is your office phone number? [phone]    7.   What is your office fax number? [phone]  8.   Anesthesia type (None, local, MAC, general) ? Anesthesia Block with IV Sed   Jacqulynn Cadet 12/17/2018, 8:32 AM  _________________________________________________________________   (provider comments below)

## 2018-12-17 NOTE — Telephone Encounter (Signed)
   Primary Cardiologist: Minus Breeding, MD  Chart reviewed as part of pre-operative protocol coverage. Given past medical history and time since last visit, based on ACC/AHA guidelines, Jessica Gould would be at acceptable risk for the planned procedure without further cardiovascular testing.   Note: pt was seen and examined by Dr. Percival Spanish in clinic yesterday, 12/16/18. He cleared for surgery and recommended that ASA be continued during the perioperative period if possible. See comments taken from his note below.   PREOP: The patient had no obstructive disease on cath.  She has no new symptoms.  She is not going for high risk procedure.  According to ACC/AHA guidelines she is at acceptable risk for the planned surgery.  Unless absolutely prohibitive I would like for her to continue the aspirin.  I will route this recommendation to the requesting party via Epic fax function and remove from pre-op pool.  Please call with questions.  Lyda Jester, PA-C 12/17/2018, 3:15 PM

## 2018-12-20 DIAGNOSIS — R1084 Generalized abdominal pain: Secondary | ICD-10-CM | POA: Diagnosis not present

## 2018-12-20 DIAGNOSIS — R1011 Right upper quadrant pain: Secondary | ICD-10-CM | POA: Diagnosis not present

## 2018-12-27 DIAGNOSIS — Z955 Presence of coronary angioplasty implant and graft: Secondary | ICD-10-CM | POA: Diagnosis not present

## 2018-12-27 DIAGNOSIS — Z9049 Acquired absence of other specified parts of digestive tract: Secondary | ICD-10-CM | POA: Diagnosis not present

## 2018-12-27 DIAGNOSIS — R198 Other specified symptoms and signs involving the digestive system and abdomen: Secondary | ICD-10-CM | POA: Diagnosis not present

## 2018-12-27 DIAGNOSIS — Z7982 Long term (current) use of aspirin: Secondary | ICD-10-CM | POA: Diagnosis not present

## 2018-12-27 DIAGNOSIS — K589 Irritable bowel syndrome without diarrhea: Secondary | ICD-10-CM | POA: Diagnosis not present

## 2018-12-27 DIAGNOSIS — E785 Hyperlipidemia, unspecified: Secondary | ICD-10-CM | POA: Diagnosis not present

## 2018-12-27 DIAGNOSIS — K5730 Diverticulosis of large intestine without perforation or abscess without bleeding: Secondary | ICD-10-CM | POA: Diagnosis not present

## 2018-12-27 DIAGNOSIS — I2510 Atherosclerotic heart disease of native coronary artery without angina pectoris: Secondary | ICD-10-CM | POA: Diagnosis not present

## 2018-12-27 DIAGNOSIS — E7800 Pure hypercholesterolemia, unspecified: Secondary | ICD-10-CM | POA: Diagnosis not present

## 2018-12-27 DIAGNOSIS — Z881 Allergy status to other antibiotic agents status: Secondary | ICD-10-CM | POA: Diagnosis not present

## 2018-12-27 DIAGNOSIS — R319 Hematuria, unspecified: Secondary | ICD-10-CM | POA: Diagnosis not present

## 2018-12-27 DIAGNOSIS — Z79899 Other long term (current) drug therapy: Secondary | ICD-10-CM | POA: Diagnosis not present

## 2018-12-27 DIAGNOSIS — Z880 Allergy status to penicillin: Secondary | ICD-10-CM | POA: Diagnosis not present

## 2018-12-27 DIAGNOSIS — Z882 Allergy status to sulfonamides status: Secondary | ICD-10-CM | POA: Diagnosis not present

## 2018-12-27 DIAGNOSIS — K760 Fatty (change of) liver, not elsewhere classified: Secondary | ICD-10-CM | POA: Diagnosis not present

## 2018-12-27 DIAGNOSIS — I10 Essential (primary) hypertension: Secondary | ICD-10-CM | POA: Diagnosis not present

## 2018-12-27 DIAGNOSIS — N390 Urinary tract infection, site not specified: Secondary | ICD-10-CM | POA: Diagnosis not present

## 2018-12-27 DIAGNOSIS — R1011 Right upper quadrant pain: Secondary | ICD-10-CM | POA: Diagnosis not present

## 2018-12-29 ENCOUNTER — Ambulatory Visit: Payer: Medicare Other | Admitting: Specialist

## 2018-12-29 DIAGNOSIS — M5136 Other intervertebral disc degeneration, lumbar region: Secondary | ICD-10-CM | POA: Diagnosis not present

## 2019-01-03 DIAGNOSIS — H2512 Age-related nuclear cataract, left eye: Secondary | ICD-10-CM | POA: Diagnosis not present

## 2019-01-03 DIAGNOSIS — H25012 Cortical age-related cataract, left eye: Secondary | ICD-10-CM | POA: Diagnosis not present

## 2019-01-03 DIAGNOSIS — H35033 Hypertensive retinopathy, bilateral: Secondary | ICD-10-CM | POA: Diagnosis not present

## 2019-01-03 DIAGNOSIS — H43822 Vitreomacular adhesion, left eye: Secondary | ICD-10-CM | POA: Diagnosis not present

## 2019-01-03 DIAGNOSIS — H35371 Puckering of macula, right eye: Secondary | ICD-10-CM | POA: Diagnosis not present

## 2019-01-06 DIAGNOSIS — M9972 Connective tissue and disc stenosis of intervertebral foramina of thoracic region: Secondary | ICD-10-CM | POA: Diagnosis not present

## 2019-01-06 DIAGNOSIS — M4697 Unspecified inflammatory spondylopathy, lumbosacral region: Secondary | ICD-10-CM | POA: Diagnosis not present

## 2019-01-06 DIAGNOSIS — M5136 Other intervertebral disc degeneration, lumbar region: Secondary | ICD-10-CM | POA: Diagnosis not present

## 2019-01-06 DIAGNOSIS — M48061 Spinal stenosis, lumbar region without neurogenic claudication: Secondary | ICD-10-CM | POA: Diagnosis not present

## 2019-01-06 DIAGNOSIS — M4694 Unspecified inflammatory spondylopathy, thoracic region: Secondary | ICD-10-CM | POA: Diagnosis not present

## 2019-01-06 DIAGNOSIS — M5134 Other intervertebral disc degeneration, thoracic region: Secondary | ICD-10-CM | POA: Diagnosis not present

## 2019-01-10 DIAGNOSIS — M5136 Other intervertebral disc degeneration, lumbar region: Secondary | ICD-10-CM | POA: Diagnosis not present

## 2019-01-11 DIAGNOSIS — M19041 Primary osteoarthritis, right hand: Secondary | ICD-10-CM | POA: Diagnosis not present

## 2019-01-11 DIAGNOSIS — M659 Synovitis and tenosynovitis, unspecified: Secondary | ICD-10-CM | POA: Diagnosis not present

## 2019-01-11 DIAGNOSIS — M13841 Other specified arthritis, right hand: Secondary | ICD-10-CM | POA: Diagnosis not present

## 2019-01-24 DIAGNOSIS — K589 Irritable bowel syndrome without diarrhea: Secondary | ICD-10-CM | POA: Diagnosis not present

## 2019-01-24 DIAGNOSIS — R109 Unspecified abdominal pain: Secondary | ICD-10-CM | POA: Diagnosis not present

## 2019-01-24 DIAGNOSIS — K760 Fatty (change of) liver, not elsewhere classified: Secondary | ICD-10-CM | POA: Diagnosis not present

## 2019-01-26 DIAGNOSIS — Z4789 Encounter for other orthopedic aftercare: Secondary | ICD-10-CM | POA: Diagnosis not present

## 2019-01-26 DIAGNOSIS — M79641 Pain in right hand: Secondary | ICD-10-CM | POA: Diagnosis not present

## 2019-01-26 DIAGNOSIS — M152 Bouchard's nodes (with arthropathy): Secondary | ICD-10-CM | POA: Diagnosis not present

## 2019-02-04 DIAGNOSIS — M79641 Pain in right hand: Secondary | ICD-10-CM | POA: Diagnosis not present

## 2019-02-04 DIAGNOSIS — E782 Mixed hyperlipidemia: Secondary | ICD-10-CM | POA: Diagnosis not present

## 2019-02-04 DIAGNOSIS — E1169 Type 2 diabetes mellitus with other specified complication: Secondary | ICD-10-CM | POA: Diagnosis not present

## 2019-02-04 DIAGNOSIS — I119 Hypertensive heart disease without heart failure: Secondary | ICD-10-CM | POA: Diagnosis not present

## 2019-02-07 DIAGNOSIS — Z23 Encounter for immunization: Secondary | ICD-10-CM | POA: Diagnosis not present

## 2019-02-07 DIAGNOSIS — Z6839 Body mass index (BMI) 39.0-39.9, adult: Secondary | ICD-10-CM | POA: Diagnosis not present

## 2019-02-07 DIAGNOSIS — I119 Hypertensive heart disease without heart failure: Secondary | ICD-10-CM | POA: Diagnosis not present

## 2019-02-07 DIAGNOSIS — D6959 Other secondary thrombocytopenia: Secondary | ICD-10-CM | POA: Diagnosis not present

## 2019-02-07 DIAGNOSIS — E782 Mixed hyperlipidemia: Secondary | ICD-10-CM | POA: Diagnosis not present

## 2019-02-07 DIAGNOSIS — R7301 Impaired fasting glucose: Secondary | ICD-10-CM | POA: Diagnosis not present

## 2019-03-08 DIAGNOSIS — M19041 Primary osteoarthritis, right hand: Secondary | ICD-10-CM | POA: Diagnosis not present

## 2019-03-08 DIAGNOSIS — M79641 Pain in right hand: Secondary | ICD-10-CM | POA: Diagnosis not present

## 2019-03-21 DIAGNOSIS — M79641 Pain in right hand: Secondary | ICD-10-CM | POA: Diagnosis not present

## 2019-03-21 DIAGNOSIS — M19041 Primary osteoarthritis, right hand: Secondary | ICD-10-CM | POA: Diagnosis not present

## 2019-04-05 DIAGNOSIS — M79641 Pain in right hand: Secondary | ICD-10-CM | POA: Diagnosis not present

## 2019-04-05 DIAGNOSIS — M19041 Primary osteoarthritis, right hand: Secondary | ICD-10-CM | POA: Diagnosis not present

## 2019-04-12 DIAGNOSIS — M79641 Pain in right hand: Secondary | ICD-10-CM | POA: Diagnosis not present

## 2019-04-12 DIAGNOSIS — M19041 Primary osteoarthritis, right hand: Secondary | ICD-10-CM | POA: Diagnosis not present

## 2019-04-20 DIAGNOSIS — M545 Low back pain: Secondary | ICD-10-CM | POA: Diagnosis not present

## 2019-04-20 DIAGNOSIS — I10 Essential (primary) hypertension: Secondary | ICD-10-CM | POA: Diagnosis not present

## 2019-04-20 DIAGNOSIS — F321 Major depressive disorder, single episode, moderate: Secondary | ICD-10-CM | POA: Diagnosis not present

## 2019-04-22 DIAGNOSIS — M79641 Pain in right hand: Secondary | ICD-10-CM | POA: Diagnosis not present

## 2019-04-22 DIAGNOSIS — M19041 Primary osteoarthritis, right hand: Secondary | ICD-10-CM | POA: Diagnosis not present

## 2019-05-02 DIAGNOSIS — Z2801 Immunization not carried out because of acute illness of patient: Secondary | ICD-10-CM | POA: Diagnosis not present

## 2019-05-02 DIAGNOSIS — M19041 Primary osteoarthritis, right hand: Secondary | ICD-10-CM | POA: Diagnosis not present

## 2019-05-02 DIAGNOSIS — M79641 Pain in right hand: Secondary | ICD-10-CM | POA: Diagnosis not present

## 2019-05-02 DIAGNOSIS — I10 Essential (primary) hypertension: Secondary | ICD-10-CM | POA: Diagnosis not present

## 2019-06-04 DIAGNOSIS — Z20828 Contact with and (suspected) exposure to other viral communicable diseases: Secondary | ICD-10-CM | POA: Diagnosis not present

## 2019-06-13 DIAGNOSIS — I119 Hypertensive heart disease without heart failure: Secondary | ICD-10-CM | POA: Diagnosis not present

## 2019-06-13 DIAGNOSIS — E782 Mixed hyperlipidemia: Secondary | ICD-10-CM | POA: Diagnosis not present

## 2019-06-13 DIAGNOSIS — R7301 Impaired fasting glucose: Secondary | ICD-10-CM | POA: Diagnosis not present

## 2019-06-13 DIAGNOSIS — D6959 Other secondary thrombocytopenia: Secondary | ICD-10-CM | POA: Diagnosis not present

## 2019-06-13 DIAGNOSIS — Z6839 Body mass index (BMI) 39.0-39.9, adult: Secondary | ICD-10-CM | POA: Diagnosis not present

## 2019-06-13 DIAGNOSIS — M797 Fibromyalgia: Secondary | ICD-10-CM | POA: Diagnosis not present

## 2019-06-16 ENCOUNTER — Telehealth: Payer: Self-pay | Admitting: *Deleted

## 2019-06-16 NOTE — Telephone Encounter (Signed)
Jessica Gould stated she is moving to [employer].

## 2019-07-07 DIAGNOSIS — E785 Hyperlipidemia, unspecified: Secondary | ICD-10-CM | POA: Diagnosis not present

## 2019-07-07 DIAGNOSIS — I2510 Atherosclerotic heart disease of native coronary artery without angina pectoris: Secondary | ICD-10-CM | POA: Diagnosis not present

## 2019-07-07 DIAGNOSIS — I10 Essential (primary) hypertension: Secondary | ICD-10-CM | POA: Diagnosis not present

## 2019-07-07 DIAGNOSIS — Z955 Presence of coronary angioplasty implant and graft: Secondary | ICD-10-CM | POA: Diagnosis not present

## 2019-07-14 ENCOUNTER — Encounter: Payer: Self-pay | Admitting: Family Medicine

## 2019-07-14 ENCOUNTER — Telehealth (INDEPENDENT_AMBULATORY_CARE_PROVIDER_SITE_OTHER): Payer: Medicare HMO | Admitting: Family Medicine

## 2019-07-14 DIAGNOSIS — K5904 Chronic idiopathic constipation: Secondary | ICD-10-CM | POA: Diagnosis not present

## 2019-07-14 DIAGNOSIS — R600 Localized edema: Secondary | ICD-10-CM | POA: Diagnosis not present

## 2019-07-14 DIAGNOSIS — G4733 Obstructive sleep apnea (adult) (pediatric): Secondary | ICD-10-CM

## 2019-07-14 DIAGNOSIS — Z9989 Dependence on other enabling machines and devices: Secondary | ICD-10-CM | POA: Diagnosis not present

## 2019-07-14 DIAGNOSIS — R7301 Impaired fasting glucose: Secondary | ICD-10-CM | POA: Diagnosis not present

## 2019-07-14 DIAGNOSIS — I119 Hypertensive heart disease without heart failure: Secondary | ICD-10-CM

## 2019-07-14 DIAGNOSIS — Z9119 Patient's noncompliance with other medical treatment and regimen: Secondary | ICD-10-CM | POA: Diagnosis not present

## 2019-07-14 DIAGNOSIS — I10 Essential (primary) hypertension: Secondary | ICD-10-CM

## 2019-07-17 DIAGNOSIS — R7301 Impaired fasting glucose: Secondary | ICD-10-CM | POA: Insufficient documentation

## 2019-07-17 DIAGNOSIS — R600 Localized edema: Secondary | ICD-10-CM | POA: Insufficient documentation

## 2019-07-17 DIAGNOSIS — K5904 Chronic idiopathic constipation: Secondary | ICD-10-CM | POA: Insufficient documentation

## 2019-07-17 NOTE — Progress Notes (Signed)
Virtual Visit via Telephone Note   This visit type was conducted due to national recommendations for restrictions regarding the COVID-19 Pandemic (e.g. social distancing) in an effort to limit this patient's exposure and mitigate transmission in our community.  Due to her co-morbid illnesses, this patient is at least at moderate risk for complications without adequate follow up.  This format is felt to be most appropriate for this patient at this time.  The patient did not have access to video technology/had technical difficulties with video requiring transitioning to audio format only (telephone).  All issues noted in this document were discussed and addressed.  No physical exam could be performed with this format.  Patient verbally consented to a telehealth visit.   Date: July 14, 2019 10 AM  ID:  Jessica Gould, DOB 17-May-1946, MRN [id number]  Patient Location: Home Provider Location: Office  PCP:  Rochel Brome, MD   Evaluation Performed:  Follow-Up Visit  Chief Complaint:  Follow up hypertension  History of Present Illness:    Jessica Gould is a 74 y.o. female with hypertension presents via televisit in follow-up of hypertension.  At the patient's last visit I adjusted her antihypertensive medications because she was having swelling in bilateral legs.  At that time I increased her metoprolol extended release to 50 mg daily and I decreased her amlodipine to 10 mg 1/2 pill daily.  She reports her blood pressure has been fairly good with systolic blood pressures 0000000 and diastolic blood pressures in the 70s.  She does recall having 1 elevated blood pressure 180/85 but she was very stressed out that day.  Swelling has significantly improved with decreasing her amlodipine.  The patient is in the process of moving to [facility].  As such she is unable to check her blood pressure today because everything is packed.  The patient does report decreased energy and significant  fatigue.  She does say she is not sleeping well due to the significant stress she is under.  In addition she has not been using her CPAP.  She does have a phone call into [facility] for them to "reset her system."  The patient does not have symptoms concerning for COVID-19 infection (fever, chills, cough, or new shortness of breath).     Past Medical History:  Diagnosis Date  . Arthritis   . CAD (coronary artery disease)   . GERD (gastroesophageal reflux disease)   . Headache(784.0)   . PONV (postoperative nausea and vomiting)   . Sleep apnea    On CPAP   Past Surgical History:  Procedure Laterality Date  . ABDOMINAL HYSTERECTOMY    . APPENDECTOMY    . BACK SURGERY  2004   lumbar  . BREAST BIOPSY Left 06/23/2006  . BREAST BIOPSY Left 04/12/2003  . BREAST EXCISIONAL BIOPSY Left 07/13/2006  . BREAST EXCISIONAL BIOPSY Left 2004  . CHOLECYSTECTOMY    . COLONOSCOPY  07/15/2010   Rectal Polyp, mild sigmoid diverticulosis, small internal hemorrhoids. BX: hyperplastic polyp.  Marland Kitchen DIAGNOSTIC LAPAROSCOPY    . ESOPHAGEAL DILATION  03/31/2013  . ESOPHAGOGASTRIC FUNDOPLICATION    . ESOPHAGOGASTRODUODENOSCOPY  07/15/2010   Schatzki ring, hiatal hernia, presbyesophagus   . HARDWARE REMOVAL N/A 04/04/2013   Procedure: HARDWARE REMOVAL;  Surgeon: Jessy Oto, MD;  Location: [place];  Service: Orthopedics;  Laterality: N/A;  . HELLER MYOTOMY    . LUMBAR LAMINECTOMY N/A 04/04/2013   Procedure: Left L3-4 lateral recess decompression and Left L3-4 foraminotomy, Removal of  Rod and Screws Left L4-5;  Surgeon: Jessy Oto, MD;  Location: [place];  Service: Orthopedics;  Laterality: N/A;  . TONSILLECTOMY Right    nodals removed  . TUBAL LIGATION    . UPPER GI ENDOSCOPY      Allergies:   Sulfamethoxazole-trimethoprim, Statins, Augmentin [amoxicillin-pot clavulanate], Cephalexin, Clindamycin/lincomycin, Doxycycline, Duloxetine, Other, Oxycontin [oxycodone hcl], Erythromycin, Penicillins, and  Phenobarbital   Social History   Tobacco Use  . Smoking status: Never Smoker  . Smokeless tobacco: Never Used  Substance Use Topics  . Alcohol use: No    Alcohol/week: 0.0 standard drinks  . Drug use: No     Family Hx: The patient's family history includes Heart disease (age of onset: 38) in her father; Heart disease (age of onset: 75) in her brother; Heart disease (age of onset: 13) in her sister; Kidney cancer in her father; Rheumatologic disease in her father and paternal grandmother; Stomach cancer in her mother. There is no history of Colon cancer.  ROS:   Review of Systems  Constitutional: Negative for chills, diaphoresis, fever, but does suffer from malaise/fatigue.  HENT: Negative for ear pain and sore throat.  She does report nasal congestion. Respiratory: Negative for cough and sputum production.   Cardiovascular: Negative for chest pain and palpitations.  Gastrointestinal: Negative for abdominal pain, diarrhea, nausea and vomiting.  She does have some constipation.  She has BMs every 2 to 3 days.  To 2 days Genitourinary: Negative for dysuria and urgency.  Musculoskeletal: Negative for myalgias.  She does suffer from pain in her legs.  This responded very well to gabapentin however she lost her sense of taste and smell when she took this medication.  She did test the side effects by stopping and restarting the medication.  Skin: no abnormal moles or rashes. Neurological: Negative for dizziness and headaches.  Psychiatric/Behavioral: Negative for depression. Negative for anhedonia. Negative for anxiety.   Endocrine: No polyuria, polyphagia or polydipsia.   Labs/Other Tests and Data Reviewed:    Recent Labs: 12/16/2018: BUN 22; Creatinine, Ser 0.96; Magnesium 2.1; Potassium 4.4; Sodium 140   Recent Lipid Panel No results found for: CHOL, TRIG, HDL, CHOLHDL, LDLCALC, LDLDIRECT  Wt Readings from Last 3 Encounters:  12/16/18 201 lb 12.8 oz (91.5 kg)  11/12/18 198 lb (89.8  kg)  10/15/18 200 lb (90.7 kg)     Objective:    Vital Signs:  There were no vitals taken for this visit.  Patient is calm.  No acute distress.  ASSESSMENT & PLAN:    1. Essential hypertension, benign - fair control. 2. Localized edema - resolved. 3. OSA (obstructive sleep apnea) - noncompliance. poor control. 4. Chronic idiopathic constipation - uncontrolled. 5. Impaired glucose -due for labs. Last A1C was 6.4.  Hypertension is not quite at goal.  Recommend patient start hydrochlorothiazide 25 mg once daily.  Patient already has prescription.  Continue metoprolol extended release 50 mg once daily and amlodipine 5 mg daily.  Requested patient come for lab work next week although no appointment was made as the patient was unclear of her schedule yet. Edema has resolved with decreased dose of amlodipine. Fatigue and lack of energy are multifactorial I am sure.  She is under a lot of stress due to the recent passing of her husband 3 months ago, settling his estate, and moving to [place] [place] where her daughters live.  This of course was in addition to not using her CPAP. Labs ordered for next week.  COVID-19 Education: The signs and symptoms of COVID-19 were discussed with the patient and how to seek care for testing (follow up with PCP or arrange E-visit). The importance of social distancing was discussed today.  Time:   Today, I have spent 20 minutes with the patient with telehealth technology discussing the above problems.    Follow Up:  In Person in 1 month(s)  Signed, Rochel Brome, MD  07/17/2019 12:12 AM    [place]

## 2019-07-17 NOTE — Patient Instructions (Signed)
Hypertension is not quite at goal.  Recommend patient start hydrochlorothiazide 25 mg once daily.  Patient already has prescription.  Continue metoprolol extended release 50 mg once daily and amlodipine 5 mg daily.  Requested patient come for lab work next week although no appointment was made as the patient was unclear of her schedule yet. Edema has resolved with decreased dose of amlodipine. Fatigue and lack of energy are multifactorial I am sure.  She is under a lot of stress due to the recent passing of her husband 3 months ago, settling his estate, and moving to [place] where her daughters live.  This of course was in addition to not using her CPAP.

## 2019-07-18 ENCOUNTER — Other Ambulatory Visit: Payer: Self-pay | Admitting: Cardiology

## 2019-07-20 ENCOUNTER — Other Ambulatory Visit: Payer: Medicare HMO

## 2019-07-20 ENCOUNTER — Other Ambulatory Visit: Payer: Self-pay

## 2019-07-20 DIAGNOSIS — R7301 Impaired fasting glucose: Secondary | ICD-10-CM

## 2019-07-20 DIAGNOSIS — I119 Hypertensive heart disease without heart failure: Secondary | ICD-10-CM

## 2019-07-20 DIAGNOSIS — K5904 Chronic idiopathic constipation: Secondary | ICD-10-CM

## 2019-07-20 DIAGNOSIS — Z20822 Contact with and (suspected) exposure to covid-19: Secondary | ICD-10-CM | POA: Diagnosis not present

## 2019-07-20 DIAGNOSIS — Z20828 Contact with and (suspected) exposure to other viral communicable diseases: Secondary | ICD-10-CM | POA: Diagnosis not present

## 2019-07-20 DIAGNOSIS — Z7689 Persons encountering health services in other specified circumstances: Secondary | ICD-10-CM | POA: Diagnosis not present

## 2019-07-20 LAB — CBC WITH DIFFERENTIAL/PLATELET
Basophils Absolute: 0.1 10*3/uL (ref 0.0–0.2)
Basos: 1 %
EOS (ABSOLUTE): 0.3 10*3/uL (ref 0.0–0.4)
Eos: 4 %
Hematocrit: 42.9 % (ref 34.0–46.6)
Hemoglobin: 14.4 g/dL (ref 11.1–15.9)
Immature Grans (Abs): 0 10*3/uL (ref 0.0–0.1)
Immature Granulocytes: 0 %
Lymphocytes Absolute: 1.7 10*3/uL (ref 0.7–3.1)
Lymphs: 23 %
MCH: 30.4 pg (ref 26.6–33.0)
MCHC: 33.6 g/dL (ref 31.5–35.7)
MCV: 91 fL (ref 79–97)
Monocytes Absolute: 0.6 10*3/uL (ref 0.1–0.9)
Monocytes: 8 %
Neutrophils Absolute: 4.9 10*3/uL (ref 1.4–7.0)
Neutrophils: 64 %
Platelets: 207 10*3/uL (ref 150–450)
RBC: 4.73 x10E6/uL (ref 3.77–5.28)
RDW: 13.1 % (ref 11.7–15.4)
WBC: 7.5 10*3/uL (ref 3.4–10.8)

## 2019-07-21 LAB — LIPID PANEL
Chol/HDL Ratio: 3.8 ratio (ref 0.0–4.4)
Cholesterol, Total: 154 mg/dL (ref 100–199)
HDL: 41 mg/dL (ref >39)
LDL Chol Calc (NIH): 64 mg/dL (ref 0–99)
Triglycerides: 314 mg/dL — ABNORMAL HIGH (ref 0–149)
VLDL Cholesterol Cal: 49 mg/dL — ABNORMAL HIGH (ref 5–40)

## 2019-07-21 LAB — CARDIOVASCULAR RISK ASSESSMENT

## 2019-07-22 LAB — HEMOGLOBIN A1C

## 2019-07-23 LAB — COMP. METABOLIC PANEL (12)

## 2019-07-23 LAB — HEMOGLOBIN A1C
Est. average glucose Bld gHb Est-mCnc: 134 mg/dL
Hgb A1c MFr Bld: 6.3 % — ABNORMAL HIGH (ref 4.8–5.6)

## 2019-07-23 LAB — SPECIMEN STATUS REPORT

## 2019-07-23 LAB — TSH

## 2019-07-24 ENCOUNTER — Encounter: Payer: Self-pay | Admitting: Family Medicine

## 2019-07-24 LAB — COMP. METABOLIC PANEL (12)
AST: 18 IU/L (ref 0–40)
Albumin/Globulin Ratio: 1.7 (ref 1.2–2.2)
Albumin: 4.1 g/dL (ref 3.7–4.7)
Alkaline Phosphatase: 135 IU/L — ABNORMAL HIGH (ref 39–117)
BUN/Creatinine Ratio: 22 (ref 12–28)
BUN: 21 mg/dL (ref 8–27)
Bilirubin Total: <0.2 mg/dL (ref 0.0–1.2)
Calcium: 9 mg/dL (ref 8.7–10.3)
Chloride: 105 mmol/L (ref 96–106)
Creatinine, Ser: 0.95 mg/dL (ref 0.57–1.00)
GFR calc Af Amer: 68 mL/min/{1.73_m2} (ref >59)
GFR calc non Af Amer: 59 mL/min/{1.73_m2} — ABNORMAL LOW (ref >59)
Globulin, Total: 2.4 g/dL (ref 1.5–4.5)
Glucose: 141 mg/dL — ABNORMAL HIGH (ref 65–99)
Potassium: 4.6 mmol/L (ref 3.5–5.2)
Sodium: 140 mmol/L (ref 134–144)
Total Protein: 6.5 g/dL (ref 6.0–8.5)

## 2019-07-24 LAB — TSH: TSH: 2 u[IU]/mL (ref 0.450–4.500)

## 2019-07-24 LAB — SPECIMEN STATUS REPORT

## 2019-08-02 ENCOUNTER — Other Ambulatory Visit: Payer: Self-pay | Admitting: Family Medicine

## 2019-08-06 IMAGING — MG DIGITAL SCREENING BILATERAL MAMMOGRAM WITH TOMO AND CAD
8 series · 8 of 24 positions shown · non-contrast
Comparison: Previous exam(s).

CLINICAL DATA: Screening.

EXAM:
DIGITAL SCREENING BILATERAL MAMMOGRAM WITH TOMO AND CAD

[R CC synth-2D]
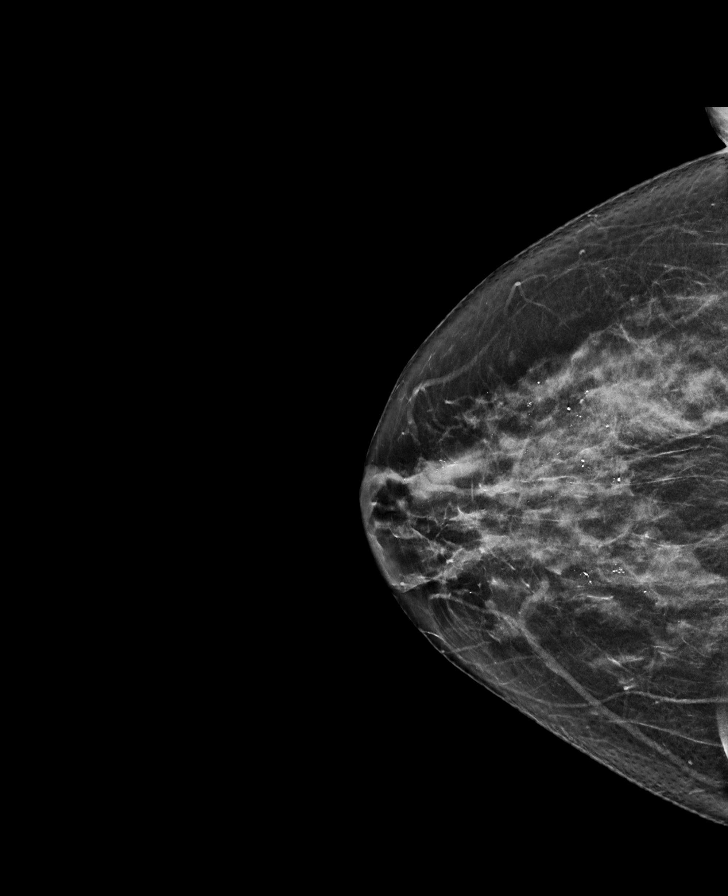

[L CC synth-2D]
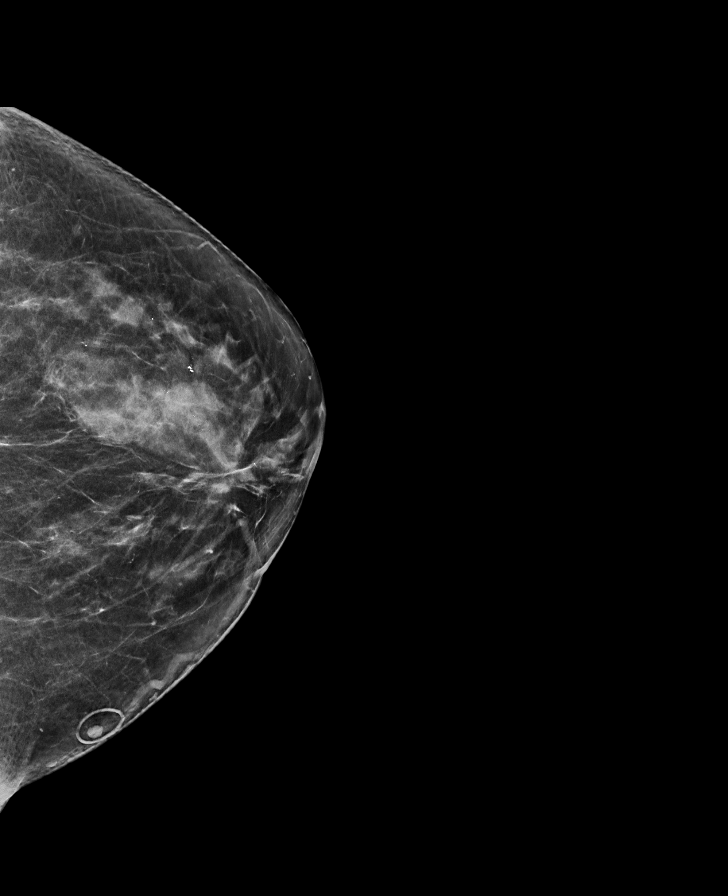

[L MLO synth-2D]
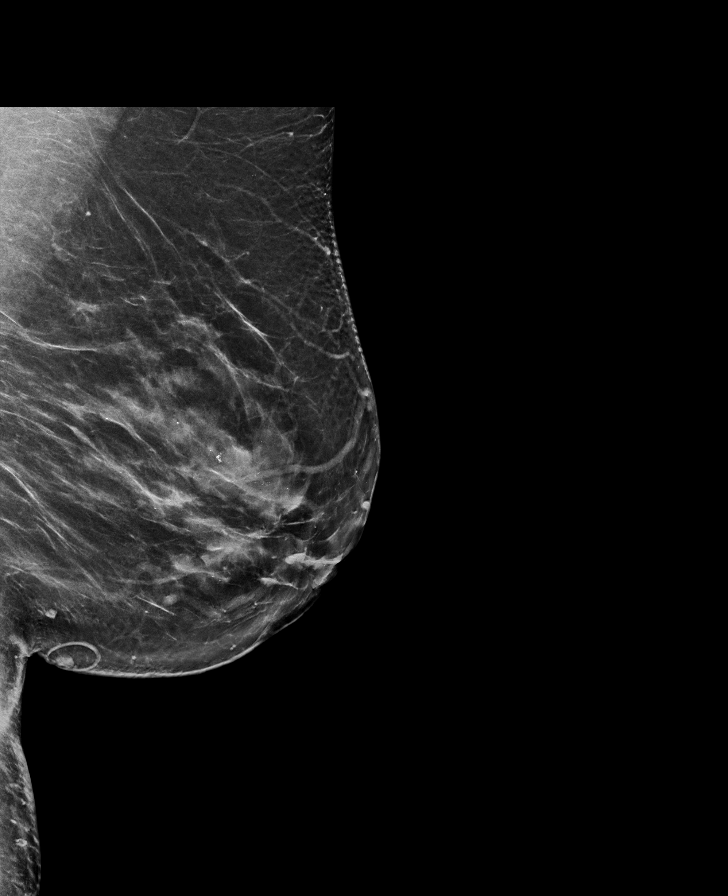

[R MLO synth-2D]
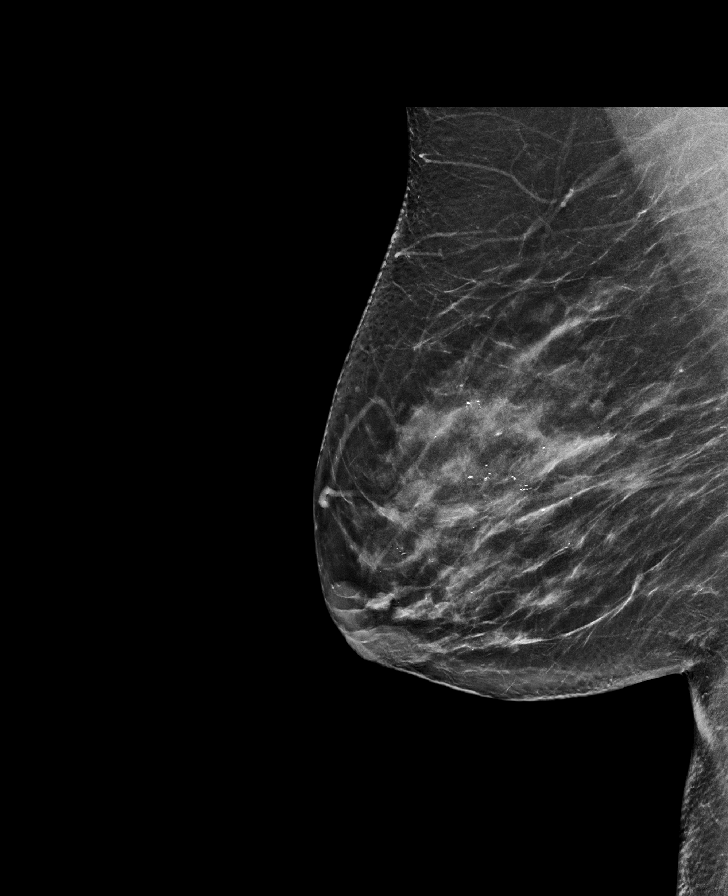

[L CC tomo · tomo slice 36/71.0]
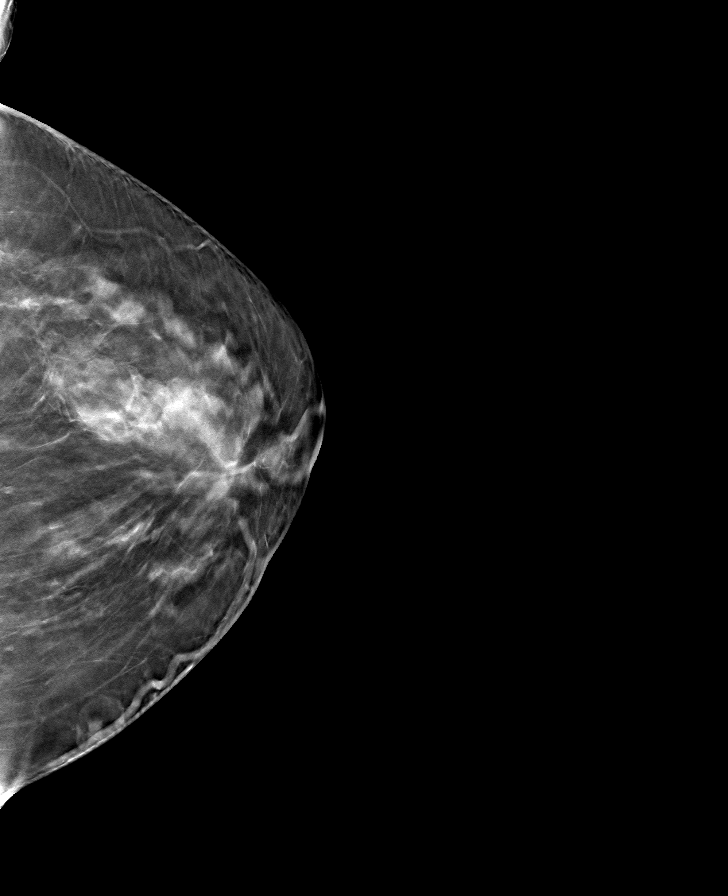

[R MLO tomo · tomo slice 39/78.0]
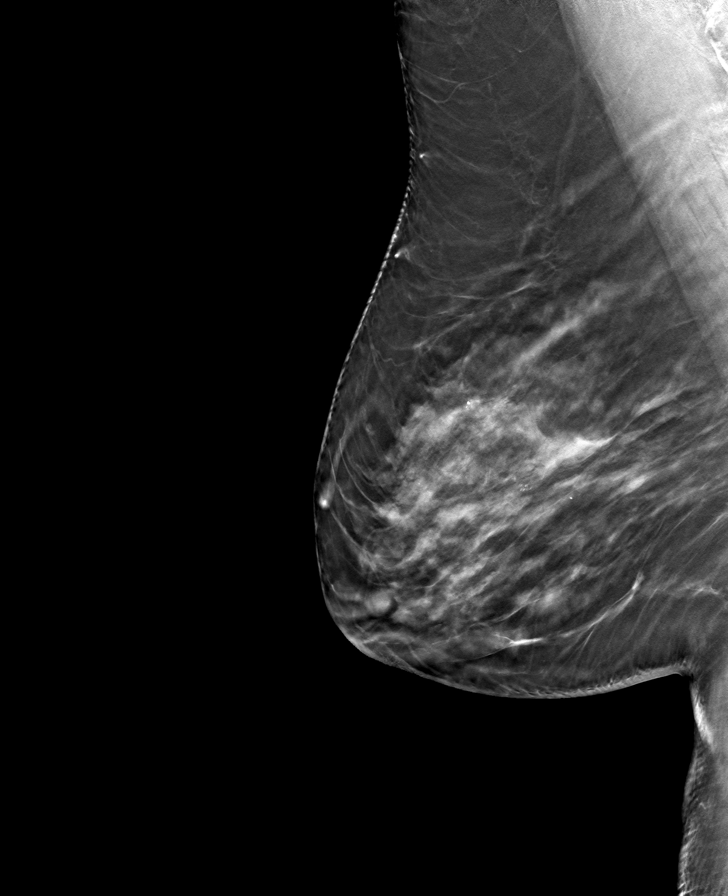

[L MLO tomo · tomo slice 39/76.0]
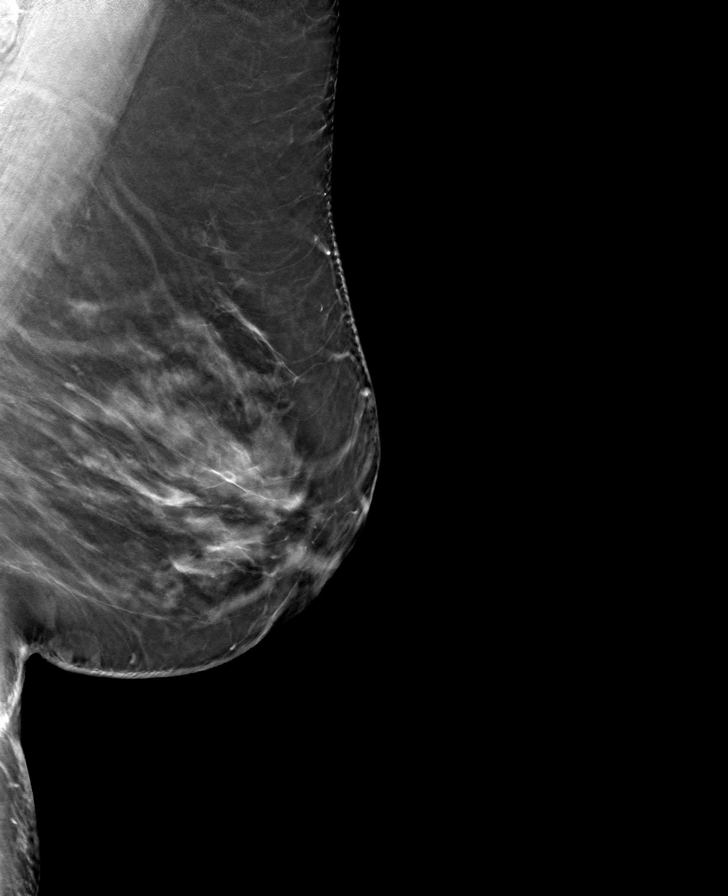

[R CC tomo · tomo slice 33/65.0]
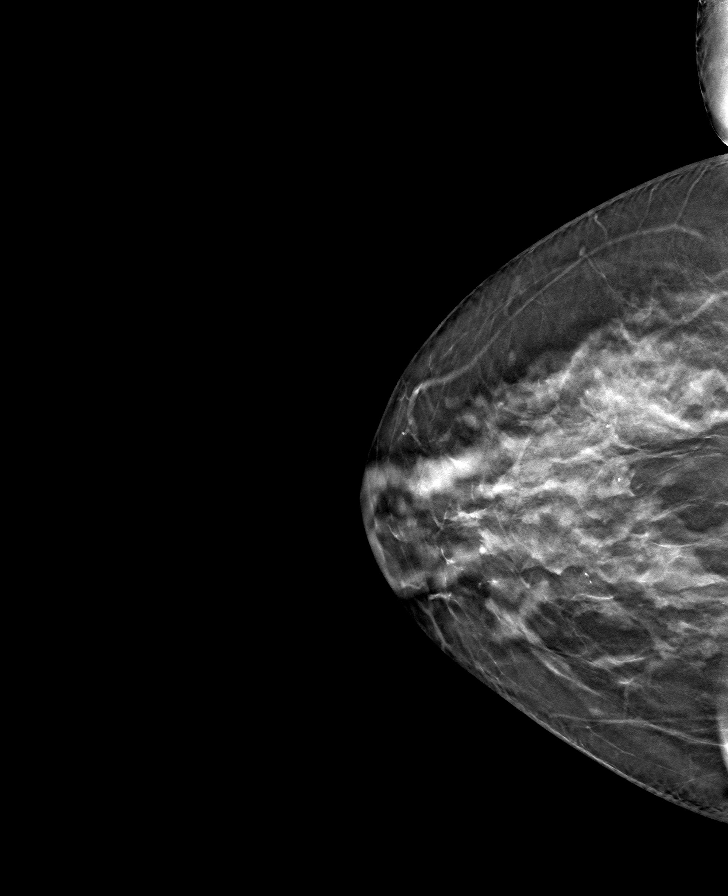

[8 of 24 positions shown; findings below may reference images not displayed]

ACR Breast Density Category c: The breast tissue is heterogeneously
dense, which may obscure small masses.
FINDINGS: There are no findings suspicious for malignancy. Images were
processed with CAD.
IMPRESSION: No mammographic evidence of malignancy. A result letter of this
screening mammogram will be mailed directly to the patient.

RECOMMENDATION:
Screening mammogram in one year. (Code:FT-U-LHB)

BI-RADS CATEGORY  1: Negative.

## 2019-08-31 DIAGNOSIS — H25012 Cortical age-related cataract, left eye: Secondary | ICD-10-CM | POA: Diagnosis not present

## 2019-08-31 DIAGNOSIS — H35033 Hypertensive retinopathy, bilateral: Secondary | ICD-10-CM | POA: Diagnosis not present

## 2019-08-31 DIAGNOSIS — H2512 Age-related nuclear cataract, left eye: Secondary | ICD-10-CM | POA: Diagnosis not present

## 2019-08-31 DIAGNOSIS — H35362 Drusen (degenerative) of macula, left eye: Secondary | ICD-10-CM | POA: Diagnosis not present

## 2019-09-06 DIAGNOSIS — H25812 Combined forms of age-related cataract, left eye: Secondary | ICD-10-CM | POA: Diagnosis not present

## 2019-09-06 DIAGNOSIS — H25012 Cortical age-related cataract, left eye: Secondary | ICD-10-CM | POA: Diagnosis not present

## 2019-09-06 DIAGNOSIS — H2512 Age-related nuclear cataract, left eye: Secondary | ICD-10-CM | POA: Diagnosis not present

## 2019-09-13 DIAGNOSIS — Z90710 Acquired absence of both cervix and uterus: Secondary | ICD-10-CM | POA: Diagnosis not present

## 2019-09-13 DIAGNOSIS — Z79899 Other long term (current) drug therapy: Secondary | ICD-10-CM | POA: Diagnosis not present

## 2019-09-13 DIAGNOSIS — Z9049 Acquired absence of other specified parts of digestive tract: Secondary | ICD-10-CM | POA: Diagnosis not present

## 2019-09-13 DIAGNOSIS — K551 Chronic vascular disorders of intestine: Secondary | ICD-10-CM | POA: Diagnosis not present

## 2019-09-13 DIAGNOSIS — G8929 Other chronic pain: Secondary | ICD-10-CM | POA: Diagnosis not present

## 2019-09-13 DIAGNOSIS — I10 Essential (primary) hypertension: Secondary | ICD-10-CM | POA: Diagnosis not present

## 2019-09-13 DIAGNOSIS — R109 Unspecified abdominal pain: Secondary | ICD-10-CM | POA: Diagnosis not present

## 2019-09-13 DIAGNOSIS — I2510 Atherosclerotic heart disease of native coronary artery without angina pectoris: Secondary | ICD-10-CM | POA: Diagnosis not present

## 2019-09-13 DIAGNOSIS — G4730 Sleep apnea, unspecified: Secondary | ICD-10-CM | POA: Diagnosis not present

## 2019-09-13 DIAGNOSIS — E7800 Pure hypercholesterolemia, unspecified: Secondary | ICD-10-CM | POA: Diagnosis not present

## 2019-09-19 DIAGNOSIS — R109 Unspecified abdominal pain: Secondary | ICD-10-CM | POA: Diagnosis not present

## 2019-09-19 DIAGNOSIS — M7918 Myalgia, other site: Secondary | ICD-10-CM | POA: Diagnosis not present

## 2019-09-19 DIAGNOSIS — G8929 Other chronic pain: Secondary | ICD-10-CM | POA: Diagnosis not present

## 2019-09-21 DIAGNOSIS — K551 Chronic vascular disorders of intestine: Secondary | ICD-10-CM | POA: Diagnosis not present

## 2019-09-27 DIAGNOSIS — M545 Low back pain, unspecified: Secondary | ICD-10-CM | POA: Insufficient documentation

## 2019-10-11 ENCOUNTER — Other Ambulatory Visit: Payer: Self-pay

## 2019-10-11 ENCOUNTER — Encounter (INDEPENDENT_AMBULATORY_CARE_PROVIDER_SITE_OTHER): Payer: Medicare HMO | Admitting: Ophthalmology

## 2019-10-11 DIAGNOSIS — H35033 Hypertensive retinopathy, bilateral: Secondary | ICD-10-CM | POA: Diagnosis not present

## 2019-10-11 DIAGNOSIS — H35373 Puckering of macula, bilateral: Secondary | ICD-10-CM | POA: Diagnosis not present

## 2019-10-11 DIAGNOSIS — H59032 Cystoid macular edema following cataract surgery, left eye: Secondary | ICD-10-CM

## 2019-10-11 DIAGNOSIS — I10 Essential (primary) hypertension: Secondary | ICD-10-CM

## 2019-10-11 DIAGNOSIS — H43813 Vitreous degeneration, bilateral: Secondary | ICD-10-CM

## 2019-11-02 ENCOUNTER — Other Ambulatory Visit: Payer: Self-pay | Admitting: Family Medicine

## 2020-03-24 DIAGNOSIS — Z0000 Encounter for general adult medical examination without abnormal findings: Secondary | ICD-10-CM | POA: Insufficient documentation

## 2020-03-29 DIAGNOSIS — R739 Hyperglycemia, unspecified: Secondary | ICD-10-CM | POA: Insufficient documentation

## 2020-04-23 DIAGNOSIS — L859 Epidermal thickening, unspecified: Secondary | ICD-10-CM | POA: Insufficient documentation

## 2020-06-11 ENCOUNTER — Telehealth (INDEPENDENT_AMBULATORY_CARE_PROVIDER_SITE_OTHER): Payer: Medicare HMO | Admitting: Family Medicine

## 2020-06-11 ENCOUNTER — Encounter: Payer: Self-pay | Admitting: Family Medicine

## 2020-06-11 ENCOUNTER — Other Ambulatory Visit: Payer: Self-pay

## 2020-06-11 DIAGNOSIS — K5904 Chronic idiopathic constipation: Secondary | ICD-10-CM

## 2020-06-11 NOTE — Progress Notes (Signed)
Cancelled. Wrong Labrina Eblin. kc

## 2020-06-12 ENCOUNTER — Telehealth (INDEPENDENT_AMBULATORY_CARE_PROVIDER_SITE_OTHER): Payer: Medicare HMO | Admitting: Family Medicine

## 2020-06-12 DIAGNOSIS — R600 Localized edema: Secondary | ICD-10-CM

## 2020-06-12 NOTE — Progress Notes (Signed)
Cancelled.  

## 2020-09-10 ENCOUNTER — Other Ambulatory Visit: Payer: Self-pay

## 2020-09-10 ENCOUNTER — Encounter: Payer: Self-pay | Admitting: Specialist

## 2020-09-10 ENCOUNTER — Ambulatory Visit (INDEPENDENT_AMBULATORY_CARE_PROVIDER_SITE_OTHER): Payer: Medicare HMO

## 2020-09-10 ENCOUNTER — Ambulatory Visit: Payer: Medicare HMO | Admitting: Specialist

## 2020-09-10 VITALS — BP 132/74 | HR 64 | Ht 61.0 in | Wt 210.0 lb

## 2020-09-10 DIAGNOSIS — M5136 Other intervertebral disc degeneration, lumbar region: Secondary | ICD-10-CM

## 2020-09-10 DIAGNOSIS — M4326 Fusion of spine, lumbar region: Secondary | ICD-10-CM | POA: Diagnosis not present

## 2020-09-10 DIAGNOSIS — H9319 Tinnitus, unspecified ear: Secondary | ICD-10-CM | POA: Diagnosis not present

## 2020-09-10 MED ORDER — ACETAMINOPHEN 500 MG PO TABS: 500.0000 mg | ORAL_TABLET | Freq: Four times a day (QID) | ORAL | 0 refills | Status: AC | PRN

## 2020-09-10 MED ORDER — NORTRIPTYLINE HCL 10 MG PO CAPS: 10.0000 mg | ORAL_CAPSULE | Freq: Every day | ORAL | 3 refills | Status: DC

## 2020-09-10 NOTE — Progress Notes (Signed)
Office Visit Note   Patient: Jessica Gould           Date of Birth: Oct 11, 1945           MRN: [id number] Visit Date: 09/10/2020              Requested by: Rochel Brome, MD [address],  [place] [address] PCP: Rochel Brome, MD   Assessment & Plan: Visit Diagnoses:  1. Degenerative disc disease, lumbar   2. Fusion of spine of lumbar region   3. Tinnitus, unspecified laterality    Plan:Avoid frequent bending and stooping  No lifting greater than 10 lbs. May use ice or moist heat for pain. Weight loss is of benefit. Best medication for lumbar disc disease is arthritis medications like motrin, celebrex and naprosyn but you are unable to take This. You are also intolerant of most meds normally used with degenerative disc disease. Consider pain management for evaluation to determine if facet injection may be of benefit. If the pain is mainly disc related the only surgical solution would be long fusion. Exercise is important to improve your indurance and does allow people to function better inspite of back pain.  Injection with steroid may be of benefit. Hemp CBD capsules, amazon.com 5,000-7,000 mg per bottle, 60 capsules per bottle, take one capsule twice a day. Cane in the left hand to use with left leg weight bearing. Follow-Up Instructions: No follow-ups on file.  Consider a course of PT there in [place] before pain management.  Please discuss these options with your primary care physician as I am not familiar with the spine programs in [place] and the therapy groups. I am happy to help you at anytime but a program of conservative management is more likely to succeed if you do not have to travel long distances to be treated and I believe There are programs that are close to you that are very good including [facility] and [facility] programs that have pain management programs.    : No follow-ups on file.   Orders:  Orders Placed This Encounter   Procedures  . XR Lumbar Spine 2-3 Views   No orders of the defined types were placed in this encounter.     Procedures: No procedures performed   Clinical Data: No additional findings.   Subjective: No chief complaint on file.   75 year old female with history of lumbar spine fusion L4-5 in 2007 and then Decompression in 2014 at L3-4 and L4-5 for stenosis. She relates that she is experiencing is mainly in her back, a few years ago did have leg pain. It's just low back pain mainly. She is noticing pain with activity. Uses Icey hot. In the low back and both sides and middle of the lumbar spine. She notes that the back catches on her from time to time. No bowel or bladder difficulties, on two BP and anticholesteral meds.  Pain with bending and twisting and lifting, carrying grocery and laundry can be difficult. Picking up pavers increased the pain. She had pain and after 3-4 days the pain stopped.    Review of Systems  Constitutional: Negative.  Negative for activity change, appetite change, chills, diaphoresis, fatigue, fever and unexpected weight change.  HENT: Negative for congestion, dental problem, drooling, ear discharge, ear pain, facial swelling, hearing loss, mouth sores, nosebleeds, postnasal drip, rhinorrhea, sinus pressure, sinus pain, sneezing, sore throat, tinnitus and trouble swallowing.   Respiratory: Positive for apnea,  cough, shortness of breath and wheezing.   Cardiovascular: Positive for leg swelling. Negative for chest pain.  Gastrointestinal: Negative.  Negative for abdominal distention, abdominal pain, anal bleeding, blood in stool, constipation, diarrhea, nausea, rectal pain and vomiting.  Endocrine: Negative for cold intolerance, heat intolerance, polydipsia, polyphagia and polyuria.  Genitourinary: Negative.  Negative for difficulty urinating, dyspareunia, dysuria, enuresis, flank pain, frequency, genital sores, hematuria, menstrual problem, pelvic pain and  urgency.  Musculoskeletal: Positive for back pain and gait problem. Negative for arthralgias, joint swelling, myalgias, neck pain and neck stiffness.  Skin: Negative.  Negative for color change, pallor, rash and wound.  Allergic/Immunologic: Positive for environmental allergies. Negative for food allergies and immunocompromised state.  Neurological: Negative for dizziness, tremors, seizures, syncope, facial asymmetry, speech difficulty, weakness, light-headedness, numbness and headaches.  Hematological: Negative for adenopathy. Does not bruise/bleed easily.  Psychiatric/Behavioral: Negative.  Negative for agitation, behavioral problems, confusion, decreased concentration, dysphoric mood, hallucinations, self-injury, sleep disturbance and suicidal ideas. The patient is not nervous/anxious and is not hyperactive.      Objective: Vital Signs: BP 132/74   Pulse 64   Ht 5\' 1"  (1.549 m)   Wt 210 lb (95.3 kg)   BMI 39.68 kg/m   Physical Exam Constitutional:      Appearance: She is well-developed.  HENT:     Head: Normocephalic and atraumatic.  Eyes:     Pupils: Pupils are equal, round, and reactive to light.  Pulmonary:     Effort: Pulmonary effort is normal.     Breath sounds: Normal breath sounds.  Abdominal:     General: Bowel sounds are normal.     Palpations: Abdomen is soft.  Musculoskeletal:        General: Normal range of motion.     Cervical back: Normal range of motion and neck supple.     Lumbar back: Negative right straight leg raise test and negative left straight leg raise test.  Skin:    General: Skin is warm and dry.  Neurological:     Mental Status: She is alert and oriented to person, place, and time.  Psychiatric:        Behavior: Behavior normal.        Thought Content: Thought content normal.        Judgment: Judgment normal.     Back Exam   Tenderness  The patient is experiencing tenderness in the lumbar.  Range of Motion  Extension: normal   Flexion: normal  Lateral bend right: normal  Lateral bend left: normal  Rotation right: normal  Rotation left: normal   Muscle Strength  Right Quadriceps:  5/5  Left Quadriceps:  5/5  Right Hamstrings:  5/5  Left Hamstrings:  5/5   Tests  Straight leg raise right: negative Straight leg raise left: negative  Reflexes  Patellar: 0/4 Achilles: 0/4      Specialty Comments:  No specialty comments available.  Imaging: XR Lumbar Spine 2-3 Views  Result Date: 09/10/2020 AP and lateral flexion and extension radiographs of the lumbar spine demonstrate disc narrowing L3-4 greater than L2-3 and L1-2. There is fusion at the L4-5 level, hardware is removed but the interbody fusion and posterolateral fusion masses appear solidly fused.     PMFS History: Patient Active Problem List   Diagnosis Date Noted  . Hyperkeratosis of sole 04/23/2020  . Hyperglycemia 03/29/2020  . Annual physical exam 03/24/2020  . Chronic right-sided low back pain without sciatica 09/27/2019  . Localized edema 07/17/2019  .  Chronic idiopathic constipation 07/17/2019  . Impaired fasting glucose 07/17/2019  . Arthritis of hand 12/13/2018  . 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 CAD (coronary artery disease) 04/01/2018  . IBS (irritable bowel syndrome) 08/24/2017  . S/P laparoscopic fundoplication 67/67/2094  . NAFLD (nonalcoholic fatty liver disease) 03/10/2017  . Morbid obesity ([place]) 01/07/2017  . OSA (obstructive sleep apnea) 11/04/2016  . Dyslipidemia 09/26/2016  . History of anxiety 09/26/2016  . History of depression 09/26/2016  . History of migraine headaches 09/26/2016  . History of coronary artery disease 09/26/2016  . Spinal stenosis of lumbar region 04/05/2013    Class: Diagnosis of  . ESSENTIAL HYPERTENSION, BENIGN 01/09/2009  . DYSLIPIDEMIA 01/05/2009  . ANXIETY 01/05/2009  . DEPRESSION 01/05/2009  . MIGRAINE HEADACHE 01/05/2009  . TINNITUS  01/05/2009  . SPINAL STENOSIS, LUMBAR 01/05/2009  . COLONIC POLYPS, HX OF 01/05/2009  . History of gastroesophageal reflux (GERD) 01/05/2009   Past Medical History:  Diagnosis Date  . Arthritis   . CAD (coronary artery disease)   . GERD (gastroesophageal reflux disease)   . Headache(784.0)   . PONV (postoperative nausea and vomiting)   . Sleep apnea    On CPAP    Family History  Problem Relation Age of Onset  . Heart disease Father 52  . Rheumatologic disease Father   . Kidney cancer Father   . Stomach cancer Mother   . Heart disease Brother 97  . Heart disease Sister 64  . Rheumatologic disease Paternal Grandmother   . Colon cancer Neg Hx     Past Surgical History:  Procedure Laterality Date  . ABDOMINAL HYSTERECTOMY    . APPENDECTOMY    . BACK SURGERY  2004   lumbar  . BREAST BIOPSY Left 06/23/2006  . BREAST BIOPSY Left 04/12/2003  . BREAST EXCISIONAL BIOPSY Left 07/13/2006  . BREAST EXCISIONAL BIOPSY Left 2004  . CHOLECYSTECTOMY    . COLONOSCOPY  07/15/2010   Rectal Polyp, mild sigmoid diverticulosis, small internal hemorrhoids. BX: hyperplastic polyp.  [facility] Kitchen DIAGNOSTIC LAPAROSCOPY    . ESOPHAGEAL DILATION  03/31/2013  . ESOPHAGOGASTRIC FUNDOPLICATION    . ESOPHAGOGASTRODUODENOSCOPY  07/15/2010   Schatzki ring, hiatal hernia, presbyesophagus   . HARDWARE REMOVAL N/A 04/04/2013   Procedure: HARDWARE REMOVAL;  Surgeon: Jessy Oto, MD;  Location: [facility];  Service: Orthopedics;  Laterality: N/A;  . HELLER MYOTOMY    . LUMBAR LAMINECTOMY N/A 04/04/2013   Procedure: Left L3-4 lateral recess decompression and Left L3-4 foraminotomy, Removal of Rod and Screws Left L4-5;  Surgeon: Jessy Oto, MD;  Location: [facility];  Service: Orthopedics;  Laterality: N/A;  . TONSILLECTOMY Right    nodals removed  . TUBAL LIGATION    . UPPER GI ENDOSCOPY     Social History   Occupational History  . Not on file  Tobacco Use  . Smoking status: Never Smoker  . Smokeless tobacco: Never  Used  Vaping Use  . Vaping Use: Never used  Substance and Sexual Activity  . Alcohol use: No    Alcohol/week: 0.0 standard drinks  . Drug use: No  . Sexual activity: Not on file

## 2020-09-10 NOTE — Patient Instructions (Addendum)
Plan:Avoid frequent bending and stooping  No lifting greater than 10 lbs. May use ice or moist heat for pain. Weight loss is of benefit. Best medication for lumbar disc disease is arthritis medications like motrin, celebrex and naprosyn but you are unable to take This. You are also intolerant of most meds normally used with degenerative disc disease. Consider pain management for evaluation to determine if facet injection may be of benefit. If the pain is mainly disc related the only surgical solution would be long fusion. Exercise is important to improve your indurance and does allow people to function better inspite of back pain.  Injection with steroid may be of benefit. Hemp CBD capsules, amazon.com 5,000-7,000 mg per bottle, 60 capsules per bottle, take one capsule twice a day.  Follow-Up Instructions: No follow-ups on file.  Consider a course of PT there in [place] before pain management.  Please discuss these options with your primary care physician as I am not familiar with the spine programs in [place] and the therapy groups. I am happy to help you at anytime but a program of conservative management is more likely to succeed if you do not have to travel long distances to be treated and I believe There are programs that are close to you that are very good including [facility] and [facility] programs that have pain management programs.

## 2020-10-05 ENCOUNTER — Other Ambulatory Visit: Payer: Self-pay | Admitting: Specialist

## 2020-10-05 NOTE — Telephone Encounter (Signed)
Pls advise.  

## 2021-06-17 ENCOUNTER — Other Ambulatory Visit: Payer: Self-pay | Admitting: Specialist

## 2021-11-19 ENCOUNTER — Telehealth: Payer: Self-pay | Admitting: Specialist
